# Patient Record
Sex: Female | Born: 1960 | Race: White | Hispanic: No | Smoking: Former smoker
Health system: Southern US, Community
[De-identification: ages and names within clinical notes are randomized; demographics above are authoritative.]

## PROBLEM LIST (undated history)

## (undated) DIAGNOSIS — R42 Dizziness and giddiness: Secondary | ICD-10-CM

## (undated) DIAGNOSIS — D649 Anemia, unspecified: Secondary | ICD-10-CM

## (undated) DIAGNOSIS — M8580 Other specified disorders of bone density and structure, unspecified site: Secondary | ICD-10-CM

## (undated) DIAGNOSIS — T148XXA Other injury of unspecified body region, initial encounter: Secondary | ICD-10-CM

## (undated) DIAGNOSIS — K589 Irritable bowel syndrome without diarrhea: Secondary | ICD-10-CM

## (undated) DIAGNOSIS — H9190 Unspecified hearing loss, unspecified ear: Secondary | ICD-10-CM

## (undated) DIAGNOSIS — K219 Gastro-esophageal reflux disease without esophagitis: Secondary | ICD-10-CM

## (undated) DIAGNOSIS — J302 Other seasonal allergic rhinitis: Secondary | ICD-10-CM

## (undated) DIAGNOSIS — M419 Scoliosis, unspecified: Secondary | ICD-10-CM

## (undated) DIAGNOSIS — R197 Diarrhea, unspecified: Secondary | ICD-10-CM

## (undated) DIAGNOSIS — Z9622 Myringotomy tube(s) status: Secondary | ICD-10-CM

## (undated) DIAGNOSIS — Z9889 Other specified postprocedural states: Secondary | ICD-10-CM

## (undated) DIAGNOSIS — A6000 Herpesviral infection of urogenital system, unspecified: Secondary | ICD-10-CM

## (undated) HISTORY — PX: EYE SURGERY: SHX253

## (undated) HISTORY — DX: Other specified disorders of bone density and structure, unspecified site: M85.80

## (undated) HISTORY — DX: Other injury of unspecified body region, initial encounter: T14.8XXA

## (undated) HISTORY — DX: Herpesviral infection of urogenital system, unspecified: A60.00

## (undated) HISTORY — PX: MULTIPLE TOOTH EXTRACTIONS: SHX2053

## (undated) HISTORY — PX: TONSILLECTOMY: SUR1361

## (undated) HISTORY — PX: BACK SURGERY: SHX140

## (undated) HISTORY — PX: ABDOMINAL HYSTERECTOMY: SHX81

---

## 1999-03-04 ENCOUNTER — Ambulatory Visit: Admission: RE | Admit: 1999-03-04 | Discharge: 1999-03-04 | Payer: Self-pay | Admitting: Occupational Medicine

## 1999-03-04 ENCOUNTER — Encounter: Payer: Self-pay | Admitting: Occupational Medicine

## 2003-06-30 ENCOUNTER — Ambulatory Visit (HOSPITAL_COMMUNITY): Admission: RE | Admit: 2003-06-30 | Discharge: 2003-06-30 | Payer: Self-pay | Admitting: Family Medicine

## 2003-07-04 ENCOUNTER — Encounter: Admission: RE | Admit: 2003-07-04 | Discharge: 2003-08-11 | Payer: Self-pay | Admitting: Family Medicine

## 2005-06-16 ENCOUNTER — Emergency Department (HOSPITAL_COMMUNITY): Admission: EM | Admit: 2005-06-16 | Discharge: 2005-06-16 | Payer: Self-pay | Admitting: Emergency Medicine

## 2005-09-21 ENCOUNTER — Ambulatory Visit (HOSPITAL_COMMUNITY): Admission: RE | Admit: 2005-09-21 | Discharge: 2005-09-21 | Payer: Self-pay | Admitting: Family Medicine

## 2006-05-16 HISTORY — PX: TOTAL ABDOMINAL HYSTERECTOMY W/ BILATERAL SALPINGOOPHORECTOMY: SHX83

## 2006-11-16 ENCOUNTER — Ambulatory Visit (HOSPITAL_COMMUNITY): Admission: RE | Admit: 2006-11-16 | Discharge: 2006-11-16 | Payer: Self-pay | Admitting: Family Medicine

## 2007-04-02 ENCOUNTER — Inpatient Hospital Stay (HOSPITAL_COMMUNITY): Admission: RE | Admit: 2007-04-02 | Discharge: 2007-04-05 | Payer: Self-pay | Admitting: Obstetrics & Gynecology

## 2007-04-02 ENCOUNTER — Encounter: Payer: Self-pay | Admitting: Obstetrics & Gynecology

## 2007-04-14 ENCOUNTER — Encounter: Payer: Self-pay | Admitting: Emergency Medicine

## 2007-04-15 ENCOUNTER — Encounter: Payer: Self-pay | Admitting: Obstetrics & Gynecology

## 2007-04-15 ENCOUNTER — Inpatient Hospital Stay (HOSPITAL_COMMUNITY): Admission: AD | Admit: 2007-04-15 | Discharge: 2007-04-17 | Payer: Self-pay | Admitting: Obstetrics & Gynecology

## 2007-04-15 ENCOUNTER — Ambulatory Visit: Payer: Self-pay | Admitting: Obstetrics & Gynecology

## 2007-07-29 ENCOUNTER — Emergency Department (HOSPITAL_COMMUNITY): Admission: EM | Admit: 2007-07-29 | Discharge: 2007-07-29 | Payer: Self-pay | Admitting: Emergency Medicine

## 2007-10-24 ENCOUNTER — Ambulatory Visit: Payer: Self-pay | Admitting: *Deleted

## 2007-10-24 ENCOUNTER — Ambulatory Visit: Payer: Self-pay | Admitting: Family Medicine

## 2007-10-24 DIAGNOSIS — H9190 Unspecified hearing loss, unspecified ear: Secondary | ICD-10-CM | POA: Insufficient documentation

## 2007-10-24 DIAGNOSIS — M4120 Other idiopathic scoliosis, site unspecified: Secondary | ICD-10-CM | POA: Insufficient documentation

## 2007-10-24 DIAGNOSIS — B009 Herpesviral infection, unspecified: Secondary | ICD-10-CM | POA: Insufficient documentation

## 2007-10-24 LAB — CONVERTED CEMR LAB
AST: 15 units/L (ref 0–37)
Albumin: 4.4 g/dL (ref 3.5–5.2)
Basophils Absolute: 0 10*3/uL (ref 0.0–0.1)
Basophils Relative: 0 % (ref 0–1)
Cholesterol: 189 mg/dL (ref 0–200)
Creatinine, Ser: 0.54 mg/dL (ref 0.40–1.20)
Eosinophils Absolute: 0.1 10*3/uL (ref 0.0–0.7)
Eosinophils Relative: 2 % (ref 0–5)
HCT: 40.7 % (ref 36.0–46.0)
HDL: 68 mg/dL (ref >39)
Hemoglobin: 13.2 g/dL (ref 12.0–15.0)
LDL Cholesterol: 87 mg/dL (ref 0–99)
Lymphs Abs: 1.2 10*3/uL (ref 0.7–4.0)
MCV: 89.8 fL (ref 78.0–100.0)
Monocytes Absolute: 0.4 10*3/uL (ref 0.1–1.0)
Monocytes Relative: 10 % (ref 3–12)
Neutro Abs: 2.6 10*3/uL (ref 1.7–7.7)
Platelets: 231 10*3/uL (ref 150–400)
RDW: 13.5 % (ref 11.5–15.5)
Sodium: 140 meq/L (ref 135–145)
TSH: 2.158 microintl units/mL (ref 0.350–5.50)
Total Protein: 6.8 g/dL (ref 6.0–8.3)
Triglycerides: 170 mg/dL — ABNORMAL HIGH (ref <150)
VLDL: 34 mg/dL (ref 0–40)

## 2007-10-30 ENCOUNTER — Encounter (INDEPENDENT_AMBULATORY_CARE_PROVIDER_SITE_OTHER): Payer: Self-pay | Admitting: Family Medicine

## 2007-12-04 ENCOUNTER — Ambulatory Visit: Payer: Self-pay | Admitting: Family Medicine

## 2007-12-04 DIAGNOSIS — R358 Other polyuria: Secondary | ICD-10-CM

## 2007-12-05 ENCOUNTER — Encounter (INDEPENDENT_AMBULATORY_CARE_PROVIDER_SITE_OTHER): Payer: Self-pay | Admitting: Family Medicine

## 2007-12-25 ENCOUNTER — Ambulatory Visit (HOSPITAL_COMMUNITY): Admission: RE | Admit: 2007-12-25 | Discharge: 2007-12-25 | Payer: Self-pay | Admitting: Family Medicine

## 2007-12-31 ENCOUNTER — Ambulatory Visit (HOSPITAL_COMMUNITY): Admission: RE | Admit: 2007-12-31 | Discharge: 2007-12-31 | Payer: Self-pay | Admitting: Family Medicine

## 2008-02-29 ENCOUNTER — Ambulatory Visit: Payer: Self-pay | Admitting: Cardiology

## 2008-03-02 ENCOUNTER — Emergency Department (HOSPITAL_COMMUNITY): Admission: EM | Admit: 2008-03-02 | Discharge: 2008-03-02 | Payer: Self-pay | Admitting: Emergency Medicine

## 2008-03-17 ENCOUNTER — Ambulatory Visit: Payer: Self-pay

## 2008-06-17 ENCOUNTER — Encounter (HOSPITAL_COMMUNITY): Admission: RE | Admit: 2008-06-17 | Discharge: 2008-07-17 | Payer: Self-pay | Admitting: Family Medicine

## 2008-06-17 ENCOUNTER — Ambulatory Visit (HOSPITAL_COMMUNITY): Admission: RE | Admit: 2008-06-17 | Discharge: 2008-06-17 | Payer: Self-pay | Admitting: Family Medicine

## 2008-07-22 ENCOUNTER — Encounter (HOSPITAL_COMMUNITY): Admission: RE | Admit: 2008-07-22 | Discharge: 2008-08-21 | Payer: Self-pay | Admitting: Family Medicine

## 2008-09-19 ENCOUNTER — Emergency Department (HOSPITAL_COMMUNITY): Admission: EM | Admit: 2008-09-19 | Discharge: 2008-09-19 | Payer: Self-pay | Admitting: Emergency Medicine

## 2008-10-07 ENCOUNTER — Encounter (HOSPITAL_COMMUNITY): Admission: RE | Admit: 2008-10-07 | Discharge: 2008-11-06 | Payer: Self-pay | Admitting: Specialist

## 2009-01-30 ENCOUNTER — Ambulatory Visit (HOSPITAL_COMMUNITY): Admission: RE | Admit: 2009-01-30 | Discharge: 2009-01-30 | Payer: Self-pay | Admitting: Family Medicine

## 2009-09-13 ENCOUNTER — Emergency Department (HOSPITAL_COMMUNITY): Admission: EM | Admit: 2009-09-13 | Discharge: 2009-09-13 | Payer: Self-pay | Admitting: Emergency Medicine

## 2009-10-18 ENCOUNTER — Emergency Department (HOSPITAL_COMMUNITY): Admission: EM | Admit: 2009-10-18 | Discharge: 2009-10-18 | Payer: Self-pay | Admitting: Emergency Medicine

## 2009-11-28 ENCOUNTER — Emergency Department (HOSPITAL_COMMUNITY): Admission: EM | Admit: 2009-11-28 | Discharge: 2009-11-28 | Payer: Self-pay | Admitting: Emergency Medicine

## 2010-01-28 ENCOUNTER — Ambulatory Visit (HOSPITAL_COMMUNITY): Admission: RE | Admit: 2010-01-28 | Discharge: 2010-01-28 | Payer: Self-pay | Admitting: Family Medicine

## 2010-03-11 ENCOUNTER — Ambulatory Visit (HOSPITAL_COMMUNITY): Admission: RE | Admit: 2010-03-11 | Discharge: 2010-03-11 | Payer: Self-pay | Admitting: Family Medicine

## 2010-04-02 ENCOUNTER — Encounter (HOSPITAL_COMMUNITY)
Admission: RE | Admit: 2010-04-02 | Discharge: 2010-05-02 | Payer: Self-pay | Source: Home / Self Care | Attending: Family Medicine | Admitting: Family Medicine

## 2010-05-04 ENCOUNTER — Encounter (HOSPITAL_COMMUNITY)
Admission: RE | Admit: 2010-05-04 | Discharge: 2010-06-03 | Payer: Self-pay | Source: Home / Self Care | Attending: Family Medicine | Admitting: Family Medicine

## 2010-06-07 ENCOUNTER — Encounter: Payer: Self-pay | Admitting: Family Medicine

## 2010-08-03 LAB — URINE MICROSCOPIC-ADD ON

## 2010-08-03 LAB — URINALYSIS, ROUTINE W REFLEX MICROSCOPIC
Bilirubin Urine: NEGATIVE
Glucose, UA: NEGATIVE mg/dL
Ketones, ur: NEGATIVE mg/dL
Nitrite: NEGATIVE
Protein, ur: NEGATIVE mg/dL
Specific Gravity, Urine: 1.02 (ref 1.005–1.030)
Urobilinogen, UA: 0.2 mg/dL (ref 0.0–1.0)
pH: 7 (ref 5.0–8.0)

## 2010-08-24 LAB — URINALYSIS, ROUTINE W REFLEX MICROSCOPIC
Bilirubin Urine: NEGATIVE
Glucose, UA: NEGATIVE mg/dL
Hgb urine dipstick: NEGATIVE
Nitrite: NEGATIVE
Specific Gravity, Urine: <1.005 — ABNORMAL LOW (ref 1.005–1.030)

## 2010-08-24 LAB — DIFFERENTIAL
Basophils Absolute: 0 10*3/uL (ref 0.0–0.1)
Eosinophils Absolute: 0 10*3/uL (ref 0.0–0.7)
Eosinophils Relative: 0 % (ref 0–5)
Lymphs Abs: 0.3 10*3/uL — ABNORMAL LOW (ref 0.7–4.0)
Monocytes Absolute: 0.2 10*3/uL (ref 0.1–1.0)

## 2010-08-24 LAB — CBC
Hemoglobin: 12.7 g/dL (ref 12.0–15.0)
MCHC: 35.6 g/dL (ref 30.0–36.0)
MCV: 84.6 fL (ref 78.0–100.0)
Platelets: 156 10*3/uL (ref 150–400)
RBC: 4.22 MIL/uL (ref 3.87–5.11)

## 2010-08-24 LAB — BASIC METABOLIC PANEL
BUN: 5 mg/dL — ABNORMAL LOW (ref 6–23)
CO2: 24 mEq/L (ref 19–32)
Chloride: 109 mEq/L (ref 96–112)
Potassium: 3.1 mEq/L — ABNORMAL LOW (ref 3.5–5.1)

## 2010-09-28 NOTE — Op Note (Signed)
[name], [name]             ACCOUNT NO.:  [id number]   MEDICAL RECORD NO.:  [id number]          PATIENT TYPE:  INP   LOCATION:  A336                          FACILITY:  [facility]   PHYSICIAN:  Lazaro Arms, M.D.   DATE OF BIRTH:  1961-04-17   DATE OF PROCEDURE:  04/02/2007  DATE OF DISCHARGE:                               OPERATIVE REPORT   PREOPERATIVE DIAGNOSES:  1. Chronic suprapubic pelvic and abdominal pain.  2. Enlarged fibroid uterus, 10 weeks size.  3. Menometrorrhagia.  4. Dysmenorrhea.  5. Dyspareunia.   POSTOPERATIVE DIAGNOSES:  1. Chronic suprapubic pelvic and abdominal pain.  2. Enlarged fibroid uterus, 10 weeks size.  3. Menometrorrhagia.  4. Dysmenorrhea.  5. Dyspareunia.  6. 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   PROCEDURE:  Total abdominal hysterectomy with bilateral salpingo-  oophorectomy.   SURGEON:  Lazaro Arms, M.D.   ANESTHESIA:  General endotracheal.   FINDINGS:  The ovaries were normal bilaterally.  They looked like they  were perimenopausal, becoming atrophic.  The uterus had a large  posterior myoma that we knew from ultrasound.  It was about 10 weeks  size total.  She had really dense, adherent bladder to the lower uterine  segment and to the cervix.  This was taken down sharply throughout.  I  did not want to push it for fear of putting a hole in the bladder by  just blunting tearing it, so the whole dissection was done sharply.  I  feel confident there was no bladder injury, no blood in the urine.  She  also had a lot of peritoneal oozing.  It just seemed like she just sort  of wept from all of her surfaces.  Her liver function tests and labs  were normal.  She takes a lot of Tylenol products but not that I am  aware of, any significant aspirin products.  I will delve into that more  fully.  Her peritoneal surfaces just sort of oozed throughout the case.   DESCRIPTION OF OPERATION:   Patient was taken to the operating room and  placed in a supine position, where she underwent general endotracheal  anesthesia.  The vagina was prepped.  Foley catheter was placed.  The  abdomen was then prepped and draped in a usual sterile fashion.  A  Pfannenstiel incision was made and carried down sharply to the rectus  fascia, scored in the midline, extended laterally.  The fascia was taken  off the muscle superiorly and inferiorly without difficulty.  There  really was no rectus muscle there, there were just kind of adhesions of  dense fascia, which probably included the muscle as well.  The  peritoneal cavity was entered without difficulty.  An __________  self-  retaining wound retractor was placed.  The upper abdomen was packed  away. The uterine cornua were grasped.  The uterus was elevated.  Could  see the dense adhesions of the bladder to the lower segment of the  cervix upon entering the peritoneal cavity.  The  round ligaments were  sutured ligated and cut bilaterally.  The infundibulopelvic ligaments  were isolated.  A vascular window made.  They were clamped, cut, and  double-suture ligated bilaterally with good hemostasis.  The uterine  vessel were skeletonized.  The bladder was taken down sharply with  Metzenbaum scissors.  Also, intrafascial incision was made with the  knife, and it was taken down from there.  This just kind of caused the  backside of the bladder peritoneal surface to ooze.  The uterus vessels  were clamped, cut, and suture-ligated.  Serial pedicels were taken down  to the cervix through the cardinal ligament with each pedicle being  clamped, cut, and suture-ligated.  The pelvis was irrigated vigorously  at this point.  Again, there were no loose pedicles, just the peritoneal  surfaces.  The vagina was cross-clamped.  Specimen was removed.  The  vaginal angle sutures were placed.  The vagina was closed with  interrupted figure-of-eight sutures.  The  pelvis was irrigated  vigorously.  Interrupted figure-of-eight 3-0 Monocryls were placed on  the bladder in sort of a very superficial, serosal imbricating manner in  order to maintain hemostasis, so as not to have a thermal injury to the  bladder.  Several interrupted sutures were placed throughout the pelvis,  infundibulopelvic ligaments, just the peritoneal surfaces, bladder,  between the infundibulopelvic ligament and the uterine cornua, all just  sort of oozed.  Care was taken to avoid the ureters bilaterally.  Gelfoam with thrombin was then placed in the pelvis for additional  hemostasis.  Interceed was placed over this.  All pedicles were  hemostatic.  The sutures were then cut.  The pelvis was once again  inspected and found to be hemostatic.  The peritoneum was closed.  The  fascia was then closed in a running fashion.  The subcutaneous tissue is  made hemostatic and irrigated.  The skin was closed using skin staples.  Pressure dressing was placed.  The patient tolerated the procedure well.  She experienced about 400 cc of blood loss and was taken to the recovery  room in good, stable condition.  All counts were correct x3.  She  received Ancef prophylactically.      Lazaro Arms, M.D.  Electronically Signed     LHE/MEDQ  D:  04/02/2007  T:  04/02/2007  Job:  [id number]

## 2010-09-28 NOTE — H&P (Signed)
Diana Wilson, OGDEN             ACCOUNT NO.:  [id number]   MEDICAL RECORD NO.:  [id number]          PATIENT TYPE:  AMB   LOCATION:  DAY                           FACILITY:  [facility]   PHYSICIAN:  Lazaro Arms, M.D.   DATE OF BIRTH:  [birth year]   DATE OF ADMISSION:  DATE OF DISCHARGE:  LH                              HISTORY & PHYSICAL   HISTORY OF PRESENT ILLNESS:  Diana Wilson is a 50 year old white female who is  admitted for a TAH/BSO.  She has an enlarged fibroid uterus.  Ultrasound  back in July 2008 reveals a volume that is consistent with 10-12 weeks'  size.  Posteriorly, there is a submucosal extension.  She also has a  significant amount of pelvic pain and dysuria and suprapubic pain.  She  was actually seen by Dr. Lindaann Slough of the [facility] and did not find any urological problems, but did recommend  she have a hysterectomy performed.  She is actually a patient of Dr.  Emelda Fear.  He evaluated her and she has had, as I said, evaluation for  that with the ultrasound as well.  She knows her options and wants to  proceed with a hysterectomy, which I have also agree with Dr. Brunilda Payor  regarding that.   PAST MEDICAL HISTORY:  Negative except for previous herpes infection.   PAST SURGICAL HISTORY:  Negative.   PAST OBSTETRICAL HISTORY:  Nulliparous.   REVIEW OF SYSTEMS:  Otherwise negative.   PHYSICAL EXAMINATION:  HEENT:  Unremarkable.  NECK:  Thyroid is normal.  LUNGS:  Clear.  HEART:  Regular rate and rhythm without murmur, regurgitation or gallop.  BREASTS:  Without masses, discharge or skin changes.  ABDOMEN:  Benign.  No hepatosplenomegaly or masses.  PELVIC:  She has normal external genitalia.  Vagina is nulliparous  without discharge.  Uterus is approximately 10 weeks' size.  Ovaries are  normal and nontender.   IMPRESSION:  1. Enlarged fibroid uterus.  2. Menometrorrhagia.  3. Dysmenorrhea.  4. Pelvic pain.   PLAN:  The patient is admitted  for TAH/BUN.  The patient understands the  risks, benefits, indications and alternatives and will proceed.      Lazaro Arms, M.D.  Electronically Signed     LHE/MEDQ  D:  04/01/2007  T:  04/02/2007  Job:  [id number]

## 2010-09-28 NOTE — Discharge Summary (Signed)
NAMEKEIRRA, Diana Wilson             ACCOUNT NO.:  [id number]   MEDICAL RECORD NO.:  [id number]          PATIENT TYPE:  INP   LOCATION:  9317                          FACILITY:  [facility]   PHYSICIAN:  Phil D. Okey Dupre, M.D.     DATE OF BIRTH:  October 22, 1960   DATE OF ADMISSION:  04/15/2007  DATE OF DISCHARGE:  04/17/2007                               DISCHARGE SUMMARY   HOSPITAL COURSE:  The patient is a 50 year old Caucasian female who  underwent total abdominal hysterectomy and bilateral salpingo-  oophorectomy on April 02, 2007.  She was admitted with pelvic  abscess.  On reviewing the chart, she had much oozing during her  operative procedure so that she developed a small hematoma.  It was not  a surprise.  On the day of admission she had this drained by radiology  with a Duval suction bulb situated in the left lower quadrant.  At the  time of discharge she still draining a very small amount, although has  been afebrile for 48 hours.  Her white blood cell count on admission was  around 20,000.  The last two days, she has had a white blood cell count  in the 9,000s.  Other significant lab tests show she is significantly  anemic with a hematocrit of 26 and hemoglobin 8.6.  She will be placed  on iron therapy.  My intention was to remove the drain prior to her  discharge, however, she did have still a small amount of drainage and  she is going to see Dr. Silas Flood in the office on tomorrow, April 18, 2007, so I will let him remove it at that time.  We are discharging her  on Augmentin 500 mg every eight hours and Flagyl 500 mg every 12 hours.  She says she needs nothing more for pain.  Her symptoms when she came in  were a significant amount of lower quadrant pain with significant  guarding.  The abdominal exam was at this point benign and the problem  seems to be well resolved.   DISCHARGE DIAGNOSIS:  Postoperative infected pelvic hematoma, resolved.      Phil D. Okey Dupre, M.D.  Electronically  Signed     PDR/MEDQ  D:  04/17/2007  T:  04/17/2007  Job:  [id number]

## 2010-09-28 NOTE — Assessment & Plan Note (Signed)
[facility]                            CARDIOLOGY OFFICE NOTE   NAME:Diana Wilson, Diana Wilson                    MRN:          [id number]  DATE:02/29/2008                            DOB:          Jan 10, 1961    [name] is a pleasant 50 year old female who presents for  evaluation of dyspnea and chest pain.  She has no prior cardiac history.  She states that for the past 4-5 years, she has had occasional dyspnea.  It occurs with walking fast or climbing stairs.  It does not occur with  routine activities.  There is no orthopnea, PND, but she does  occasionally have mild pedal edema by her report.  Note, there is no  associated cough or hemoptysis.  She also occasionally feels a chest  tightness, tightness in the upper chest area.  It is not pleuritic  positional nor is it related to food.  It occurs with stress.  It  lasts for approximately 5 minutes and resolves spontaneously.  Note, she  does not have exertional chest pain.  The pain does not radiate.  She  occasionally has diaphoresis and nausea by her report.  Because of the  above, we were asked to further evaluate.   MEDICATIONS:  1. Multivitamin.  2. Calcium 600/D b.i.d.  3. Acyclovir.  4. Vitamin E.  5. She takes Pepcid as needed.   ALLERGIES:  SULFA as well as CODEINE, FOSAMAX and most PAIN MEDICATIONS  by her report.   SOCIAL HISTORY:  She has remote history of tobacco use, but has not  smoked in 20 years.  She occasionally consumes alcohol.  She is married.   FAMILY HISTORY:  Unknown as she is adopted.   PAST MEDICAL HISTORY:  There is no diabetes mellitus, hypertension, or  hyperlipidemia.  She does have history of gastroesophageal reflux  disease.  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  She communicates reading lips.  She has had extensive previous  back surgery at age 52 due to scoliosis.  She has had a prior  hysterectomy as well as tonsillectomy.   She does have a history of  gastroesophageal reflux disease.   REVIEW OF SYSTEMS:  He denies any headaches, fevers, or chills.  There  is no productive cough or hemoptysis.  There is no dysphagia,  odynophagia, melena, or hematochezia.  There is no dysuria or hematuria.  No rash or seizure activity.  There is no orthopnea or PND, but there  can be mild pedal edema.  The remaining systems are negative.   PHYSICAL EXAMINATION:  VITAL SIGNS:  Today, her blood pressure 100/80  and her pulse is 59.  She weighs 140 pounds.  GENERAL:  She is well-developed, well-nourished, in no acute stress.  SKIN:  Warm and dry.  She does not appear to be depressed.  There is no  peripheral clubbing.  BACK:  She has had extensive previous back surgery on examination.  HEENT:  Unremarkable with exception of poor dentition.  She also has  deafness in her left ear.  Her eyelids are  normal.  NECK:  Supple with normal upstroke bilaterally and there are no bruits  noted.  There is no jugular venous distention and no thyromegaly is  noted.  CHEST:  Clear to auscultation.  No expansion.  CARDIOVASCULAR:  Regular rhythm.  Normal S1 and S2.  There are no  murmurs, rubs, or gallops noted.  There is no change Valsalva.  ABDOMEN:  Nontender and nondistended.  Positive bowel sounds.  No  hepatosplenomegaly.  No mass appreciated.  There is no abdominal bruit.  Note, she has significant stria over her abdomen.  EXTREMITIES:  She has 2+ femoral pulses bilaterally.  No bruits.  Extremities showed no edema and I can palpate no cords.  She has 2+  posterior tibial pulses bilaterally.  NEUROLOGIC:  Grossly intact.   Her electrocardiogram shows a sinus rhythm at a rate of 59.  The axis is  normal.  There are no significant ST changes.   DIAGNOSES:  1. Atypical chest pain - Ms. Boulais's symptoms are atypical.  We      will plan to proceed with a Myoview for risk stratification.  If it      shows no ischemia then we will  not pursue further cardiac workup.  2. Dyspnea - she is not volume overloaded on examination today and the      etiology of this also is unclear.  As above, we will do a stress      Myoview and this will also help [place] to quantitate her left      ventricular function.  If it is unremarkable then we will not      pursue further.  3. Gastroesophageal reflux disease.   We will see her back on as-needed basis pending the results of her  Myoview.     Madolyn Frieze Jens Som, MD, [facility]  Electronically Signed    BSC/MedQ  DD: 02/29/2008  DT: 03/01/2008  Job #: [id number]   cc:   Donna Bernard, M.D.

## 2010-10-01 NOTE — Discharge Summary (Signed)
[name], Diana Wilson             ACCOUNT NO.:  [id number]   MEDICAL RECORD NO.:  [id number]          PATIENT TYPE:  INP   LOCATION:  A336                          FACILITY:  [facility]   PHYSICIAN:  Lazaro Arms, M.D.   DATE OF BIRTH:  1960-12-18   DATE OF ADMISSION:  04/02/2007  DATE OF DISCHARGE:  11/20/2008LH                               DISCHARGE SUMMARY   DISCHARGE DIAGNOSES:  1. Status post total abdominal hysterectomy and bilateral salpingo-      oophorectomy.  2. Severe adhesions of the bladder to the lower uterine segment.  3. Increased intraoperative surface oozing requiring [place] of Gelfoam.   PROCEDURES:  1. Total abdominal hysterectomy.  2. Bilateral salpingo-oophorectomy.   HISTORY:  Please refer to the history and physical and the operative for  details on admission to the hospital.   HOSPITAL COURSE:  The patient was admitted postoperatively.  She  tolerated clear liquids and a regular diet.  She did have a lot more  intraoperative oozing than I would have expected.  She does take  nonsteroidals and she has always said that she bleeds and bruises  easily.  I had to use Gelfoam intraoperatively, which I really never do,  but I tied off everything I could tie off and that is all that I had  left at that point.  I did not put a drain in because it was not  accumulating very much, probably in the order of maybe 30-40 mL a day,  which she should resorb.  Her hemoglobin and hematocrit were 10 and 29.3  on postop day #1.  On postop day #2, they were 8.9 and 26.6, but on  postop day #3, they were back up to 9.3 and 27.1; white count was  normal.  She was afebrile.  She did well on Dilaudid and ibuprofen  combination.  Her incision was clean, dry and intact.  She did have  bruising, which I expected, and I explained to them the difficulty with  her bladder, as it was basically welded to her lower uterine segment and  this was anticipated.  She saw a urologist, who basically  recommended a  hysterectomy because of what he felt like were dense adhesions.  Her  abdominal exam was benign at the time of discharge.  She tolerated clear  liquids and a regular diet.  She was voiding.  She was ambulatory.  She  was tolerating the Dilaudid and the ibuprofen for pain.  She was  discharged to home on the morning of postop day #3 in good and stable  condition, to follow up in the office next week for incision evaluation.  The patient was given instructions and precautions to return prior to  that time.      Lazaro Arms, M.D.  Electronically Signed     LHE/MEDQ  D:  04/26/2007  T:  04/26/2007  Job:  [id number]

## 2010-10-14 ENCOUNTER — Emergency Department (HOSPITAL_COMMUNITY)
Admission: EM | Admit: 2010-10-14 | Discharge: 2010-10-14 | Disposition: A | Discharge status: Home / Self Care | Payer: Medicare Other | Attending: Emergency Medicine | Admitting: Emergency Medicine

## 2010-10-14 DIAGNOSIS — K219 Gastro-esophageal reflux disease without esophagitis: Secondary | ICD-10-CM | POA: Insufficient documentation

## 2010-10-14 DIAGNOSIS — Z79899 Other long term (current) drug therapy: Secondary | ICD-10-CM | POA: Insufficient documentation

## 2010-10-14 DIAGNOSIS — G43909 Migraine, unspecified, not intractable, without status migrainosus: Secondary | ICD-10-CM | POA: Insufficient documentation

## 2010-12-30 ENCOUNTER — Telehealth (INDEPENDENT_AMBULATORY_CARE_PROVIDER_SITE_OTHER): Payer: Self-pay | Admitting: *Deleted

## 2010-12-30 DIAGNOSIS — Z1211 Encounter for screening for malignant neoplasm of colon: Secondary | ICD-10-CM

## 2010-12-30 MED ORDER — SOD PHOS MONO-SOD PHOS DIBASIC 1.102-0.398 G PO TABS: 1.0000 | ORAL_TABLET | Freq: Once | ORAL | Status: DC

## 2010-12-30 NOTE — Telephone Encounter (Signed)
TCS sch'd 04/13/11 @ 9:30 (8:30), osmo prep instructions mailed

## 2011-02-04 ENCOUNTER — Other Ambulatory Visit: Payer: Self-pay | Admitting: Family Medicine

## 2011-02-04 DIAGNOSIS — Z139 Encounter for screening, unspecified: Secondary | ICD-10-CM

## 2011-02-07 LAB — CBC
Hemoglobin: 13.1
MCHC: 34.4
Platelets: 230
RDW: 15.2

## 2011-02-07 LAB — POCT I-STAT CREATININE
Creatinine, Ser: 0.6
Operator id: 294341

## 2011-02-07 LAB — URINALYSIS, ROUTINE W REFLEX MICROSCOPIC
Bilirubin Urine: NEGATIVE
Nitrite: NEGATIVE
Protein, ur: 100 — AB
Specific Gravity, Urine: 1.018
Urobilinogen, UA: 0.2

## 2011-02-07 LAB — I-STAT 8, (EC8 V) (CONVERTED LAB)
Acid-Base Excess: 1
Hemoglobin: 14.3
Potassium: 4
Sodium: 137
TCO2: 27
pH, Ven: 7.406 — ABNORMAL HIGH

## 2011-02-07 LAB — URINE MICROSCOPIC-ADD ON

## 2011-02-07 LAB — URINE CULTURE: Colony Count: >=100000

## 2011-02-07 LAB — WET PREP, GENITAL

## 2011-02-07 LAB — DIFFERENTIAL
Basophils Absolute: 0
Basophils Relative: 0
Lymphocytes Relative: 8 — ABNORMAL LOW
Neutro Abs: 9.4 — ABNORMAL HIGH
Neutrophils Relative %: 90 — ABNORMAL HIGH

## 2011-02-18 ENCOUNTER — Ambulatory Visit (HOSPITAL_COMMUNITY)
Admission: RE | Admit: 2011-02-18 | Discharge: 2011-02-18 | Disposition: A | Discharge status: Home / Self Care | Payer: Medicare Other | Source: Ambulatory Visit | Attending: Family Medicine | Admitting: Family Medicine

## 2011-02-18 DIAGNOSIS — Z1231 Encounter for screening mammogram for malignant neoplasm of breast: Secondary | ICD-10-CM | POA: Insufficient documentation

## 2011-02-18 DIAGNOSIS — Z139 Encounter for screening, unspecified: Secondary | ICD-10-CM

## 2011-02-21 LAB — CBC
MCV: 86.4
RBC: 3 — ABNORMAL LOW
RBC: 3.09 — ABNORMAL LOW
WBC: 9.7
WBC: 9.8

## 2011-02-21 LAB — DIFFERENTIAL
Eosinophils Absolute: 0 — ABNORMAL LOW
Lymphocytes Relative: 7 — ABNORMAL LOW
Lymphs Abs: 0.7
Monocytes Relative: 3
Neutrophils Relative %: 90 — ABNORMAL HIGH

## 2011-02-21 LAB — APTT: aPTT: 36

## 2011-02-21 LAB — PROTIME-INR
INR: 1.3
Prothrombin Time: 16 — ABNORMAL HIGH

## 2011-02-22 LAB — DIFFERENTIAL
Basophils Absolute: 0
Basophils Absolute: 0
Basophils Relative: 0
Basophils Relative: 0
Basophils Relative: 0
Basophils Relative: 0
Eosinophils Absolute: 0 — ABNORMAL LOW
Eosinophils Absolute: 0.1 — ABNORMAL LOW
Eosinophils Absolute: 0.1 — ABNORMAL LOW
Eosinophils Relative: 0
Eosinophils Relative: 0
Lymphocytes Relative: 18
Lymphocytes Relative: 4 — ABNORMAL LOW
Lymphocytes Relative: 29
Lymphs Abs: 0.9
Lymphs Abs: 1
Lymphs Abs: 1.2
Monocytes Absolute: 0.5
Monocytes Absolute: 0.7
Monocytes Relative: 4
Monocytes Relative: 8
Monocytes Relative: 8
Monocytes Relative: 8
Neutro Abs: 16.8 — ABNORMAL HIGH
Neutro Abs: 18.4 — ABNORMAL HIGH
Neutro Abs: 4.6
Neutro Abs: 4.9
Neutro Abs: 2.6
Neutrophils Relative %: 72
Neutrophils Relative %: 77
Neutrophils Relative %: 79 — ABNORMAL HIGH
Neutrophils Relative %: 92 — ABNORMAL HIGH
Neutrophils Relative %: 61

## 2011-02-22 LAB — URINALYSIS, ROUTINE W REFLEX MICROSCOPIC
Bilirubin Urine: NEGATIVE
Hgb urine dipstick: NEGATIVE
Ketones, ur: NEGATIVE
Nitrite: NEGATIVE
Protein, ur: NEGATIVE
Specific Gravity, Urine: 1.01
Urobilinogen, UA: 0.2
Urobilinogen, UA: 0.2
pH: 7

## 2011-02-22 LAB — COMPREHENSIVE METABOLIC PANEL
Alkaline Phosphatase: 51
BUN: 3 — ABNORMAL LOW
BUN: 10
CO2: 27
CO2: 30
Calcium: 9.3
Chloride: 100
Creatinine, Ser: 0.59
Creatinine, Ser: 0.57
GFR calc non Af Amer: >60
GFR calc non Af Amer: >60
Glucose, Bld: 88
Total Bilirubin: 0.8

## 2011-02-22 LAB — URINE MICROSCOPIC-ADD ON

## 2011-02-22 LAB — CBC
HCT: 29.3 — ABNORMAL LOW
HCT: 30.3 — ABNORMAL LOW
Hemoglobin: 10 — ABNORMAL LOW
Hemoglobin: 10 — ABNORMAL LOW
Hemoglobin: 9.3 — ABNORMAL LOW
Hemoglobin: 12.8
MCHC: 33.5
MCHC: 34.1
MCHC: 34.2
MCHC: 33.1
MCV: 85.3
MCV: 86.2
MCV: 86.8
MCV: 86.9
MCV: 86.3
Platelets: 200
RBC: 3.07 — ABNORMAL LOW
RBC: 3.13 — ABNORMAL LOW
RBC: 3.4 — ABNORMAL LOW
RBC: 3.49 — ABNORMAL LOW
RBC: 3.56 — ABNORMAL LOW
RBC: 4.48
RDW: 13.1
RDW: 13.4
WBC: 18.5 — ABNORMAL HIGH
WBC: 20.1 — ABNORMAL HIGH
WBC: 6.5

## 2011-02-22 LAB — CULTURE, ROUTINE-ABSCESS

## 2011-02-22 LAB — TYPE AND SCREEN
ABO/RH(D): O POS
Antibody Screen: NEGATIVE

## 2011-02-22 LAB — HCG, QUANTITATIVE, PREGNANCY: hCG, Beta Chain, Quant, S: <2

## 2011-03-23 ENCOUNTER — Other Ambulatory Visit (INDEPENDENT_AMBULATORY_CARE_PROVIDER_SITE_OTHER): Payer: Self-pay | Admitting: *Deleted

## 2011-03-23 DIAGNOSIS — Z1211 Encounter for screening for malignant neoplasm of colon: Secondary | ICD-10-CM

## 2011-05-04 ENCOUNTER — Telehealth (INDEPENDENT_AMBULATORY_CARE_PROVIDER_SITE_OTHER): Payer: Self-pay | Admitting: *Deleted

## 2011-05-04 NOTE — Telephone Encounter (Signed)
PCP/Requesting MD:  Lubertha South  Name: Diana Wilson  DOB: 04/13/1961   Procedure: TCS  Reason/Indication:  SCREENING  Has patient had this procedure before?  NO  If so, when, by whom and where?    Is there a family history of colon cancer?  NO  Who?  What age when diagnosed?    Is patient diabetic?   NO      Does patient have prosthetic heart valve?  NO  Do you have a pacemaker?  NO  Has patient had joint replacement within last 12 months?  NO  Is patient on Coumadin, Plavix and/or Aspirin? NO  Medications: DICLOFENAC 50 MG PRN, ACYCLOBIR 200 MG BID, LORATADINE 10 MG DAILY, OMEPRAZOLE 20 MG DAILY, ONE A DAY VIT FOR WOMEN, FISH OIL 1000 MG BID, NIACIN 500 MG DAILY, CALCIUM 600 MG + VIT D BID, SOY ISOFLAVONES 40 MG BID, SUPER STRENGTH PROBIOTIC, VIT C 500 MG DAILY  Allergies: PER PATIENT ALL PAIN MEDICATIONS, SULFUR DRUGS  Pharmacy: [facility] [place]  Medication Adjustment: NONE  Procedure date & time: 05/26/11 @ 830  Other: patient states has steel rods in back

## 2011-05-06 NOTE — Telephone Encounter (Signed)
approved

## 2011-05-25 MED ORDER — SODIUM CHLORIDE 0.45 % IV SOLN
Freq: Once | INTRAVENOUS | Status: AC
  Administered 2011-05-26: 08:00:00 via INTRAVENOUS

## 2011-05-26 ENCOUNTER — Other Ambulatory Visit (INDEPENDENT_AMBULATORY_CARE_PROVIDER_SITE_OTHER): Payer: Self-pay | Admitting: Internal Medicine

## 2011-05-26 ENCOUNTER — Encounter (HOSPITAL_COMMUNITY): Payer: Self-pay | Admitting: *Deleted

## 2011-05-26 ENCOUNTER — Ambulatory Visit (HOSPITAL_COMMUNITY)
Admission: RE | Admit: 2011-05-26 | Discharge: 2011-05-26 | Disposition: A | Discharge status: Home / Self Care | Payer: Medicare Other | Source: Ambulatory Visit | Attending: Internal Medicine | Admitting: Internal Medicine

## 2011-05-26 ENCOUNTER — Encounter (HOSPITAL_COMMUNITY)
Admission: RE | Disposition: A | Discharge status: Home / Self Care | Payer: Self-pay | Source: Ambulatory Visit | Attending: Internal Medicine

## 2011-05-26 DIAGNOSIS — K6289 Other specified diseases of anus and rectum: Secondary | ICD-10-CM

## 2011-05-26 DIAGNOSIS — Z1211 Encounter for screening for malignant neoplasm of colon: Secondary | ICD-10-CM

## 2011-05-26 DIAGNOSIS — K5730 Diverticulosis of large intestine without perforation or abscess without bleeding: Secondary | ICD-10-CM | POA: Insufficient documentation

## 2011-05-26 HISTORY — DX: Gastro-esophageal reflux disease without esophagitis: K21.9

## 2011-05-26 HISTORY — DX: Dizziness and giddiness: R42

## 2011-05-26 HISTORY — DX: Anemia, unspecified: D64.9

## 2011-05-26 HISTORY — DX: Unspecified hearing loss, unspecified ear: H91.90

## 2011-05-26 HISTORY — DX: Diarrhea, unspecified: R19.7

## 2011-05-26 HISTORY — DX: Myringotomy tube(s) status: Z96.22

## 2011-05-26 HISTORY — DX: Other seasonal allergic rhinitis: J30.2

## 2011-05-26 HISTORY — DX: Scoliosis, unspecified: M41.9

## 2011-05-26 HISTORY — DX: Other specified postprocedural states: Z98.890

## 2011-05-26 HISTORY — DX: Irritable bowel syndrome, unspecified: K58.9

## 2011-05-26 HISTORY — PX: COLONOSCOPY: SHX5424

## 2011-05-26 SURGERY — COLONOSCOPY
Anesthesia: Moderate Sedation

## 2011-05-26 MED ORDER — MEPERIDINE HCL 50 MG/ML IJ SOLN
INTRAMUSCULAR | Status: AC
  Filled 2011-05-26: qty 1

## 2011-05-26 MED ORDER — MIDAZOLAM HCL 5 MG/5ML IJ SOLN
INTRAMUSCULAR | Status: AC
  Filled 2011-05-26: qty 10

## 2011-05-26 MED ORDER — MEPERIDINE HCL 50 MG/ML IJ SOLN
INTRAMUSCULAR | Status: DC | PRN
  Administered 2011-05-26: 25 mg via INTRAVENOUS
  Administered 2011-05-26: 10 mg via INTRAVENOUS
  Administered 2011-05-26: 15 mg via INTRAVENOUS

## 2011-05-26 MED ORDER — MIDAZOLAM HCL 5 MG/5ML IJ SOLN
INTRAMUSCULAR | Status: DC | PRN
  Administered 2011-05-26 (×3): 1 mg via INTRAVENOUS
  Administered 2011-05-26: 2 mg via INTRAVENOUS
  Administered 2011-05-26: 1 mg via INTRAVENOUS
  Administered 2011-05-26: 2 mg via INTRAVENOUS

## 2011-05-26 MED ORDER — HYDROCORTISONE ACETATE 25 MG RE SUPP: 25.0000 mg | Freq: Every day | RECTAL | Status: AC

## 2011-05-26 MED ORDER — STERILE WATER FOR IRRIGATION IR SOLN
Status: DC | PRN
  Administered 2011-05-26: 08:00:00

## 2011-05-26 NOTE — Op Note (Signed)
COLONOSCOPY PROCEDURE REPORT  PATIENT:  Diana Wilson  MR#:  [id number] Birthdate:  11-06-1960, 51 y.o., female Endoscopist:  Dr. Malissa Hippo, MD Referred By:  Dr. Lennette Bihari, MD Procedure Date: 05/26/2011  Procedure:   Colonoscopy  Indications:  Patient is 51 year old Caucasian female who is undergoing average risk screening colonoscopy. She has IBS and occasional hematochezia felt secondary to hemorrhoids.  Informed Consent:  Procedure and risks were reviewed with the patient and informed consent was obtained.  Medications:  Demerol 50 mg IV Versed 8 mg IV  Description of procedure:  After a digital rectal exam was performed, that colonoscope was advanced from the anus through the rectum and colon to the area of the cecum, ileocecal valve and appendiceal orifice. The cecum was deeply intubated. These structures were well-seen and photographed for the record. From the level of the cecum and ileocecal valve, the scope was slowly and cautiously withdrawn. The mucosal surfaces were carefully surveyed utilizing scope tip to flexion to facilitate fold flattening as needed. The scope was pulled down into the rectum where a thorough exam including retroflexion was performed. Turning the patient on the right site facilitated passage of scope from sigmoid and descending colon  Findings:   Prep excellent. Noncompliant tortuous sigmoid colon with few diverticula. Another single diverticulum at hepatic flexure. Distal rectal mucosa with erythema friability and multiple erosions. Biopsy taken for routine histology. Anorectal junction unremarkable as seen on retroflexed view.  Therapeutic/Diagnostic Maneuvers Performed:  See above.  Complications:  None  Cecal Withdrawal Time:  8 minutes  Impression:  Examination performed to cecum. Noncompliant tortuous sigmoid colon with few small diverticula along with another diverticulum at hepatic flexure. Distal proctitis with  erosions. Biopsy taken  Recommendations:  High fiber diet. Anusol HC Suppository 1 per rectum each bedtime for 2 weeks. I will be contacting patient with biopsy results.  Orianna Biskup U  05/26/2011 9:32 AM  CC: Dr. Harlow Asa, MD, MD & Dr. Bonnetta Barry ref. provider found

## 2011-05-26 NOTE — H&P (Signed)
Diana Wilson is an 51 y.o. female.   Chief Complaint: Patient is here for colonoscopy. HPI: Patient is 51 year old Caucasian female who is here for average risk screening colonoscopy. She denies abdominal pain anorexia or weight loss. She does complain of occasional hematochezia. She has history of IBS with irregular bowel movements. Family history is negative for colorectal carcinoma  Past Medical History  Diagnosis Date  . Asthma   . Migraine   . Lightheadedness   . Seasonal allergies   . Irritable bowel syndrome   . Scoliosis   . GERD (gastroesophageal reflux disease)   . Constipation   . Diarrhea   . Anemia   . Hard of hearing   . History of submucous nasal surgery   . S/P myringotomy with insertion of tube     bilateral    Past Surgical History  Procedure Date  . Back surgery   . Abdominal hysterectomy   . Tonsillectomy   . Eye surgery     Family History  Problem Relation Age of Onset  . Adopted: Yes   Social History:  reports that she has quit smoking. She does not have any smokeless tobacco history on file. She reports that she does not drink alcohol. Her drug history not on file.  Allergies:  Allergies  Allergen Reactions  . Codeine   . Pseudoephedrine   . Sulfonamide Derivatives     Medications Prior to Admission  Medication Dose Route Frequency Provider Last Rate Last Dose  . 0.45 % sodium chloride infusion   Intravenous Once Malissa Hippo, MD 20 mL/hr at 05/26/11 0757    . meperidine (DEMEROL) 50 MG/ML injection           . midazolam (VERSED) 5 MG/5ML injection           . simethicone susp in sterile water 1000 mL irrigation    PRN Malissa Hippo, MD       Medications Prior to Admission  Medication Sig Dispense Refill  . acyclovir (ZOVIRAX) 400 MG tablet Take 400 mg by mouth daily.      . Calcium Carbonate-Vitamin D (CALCIUM + D) 600-200 MG-UNIT TABS Take 1 tablet by mouth 2 (two) times daily.      . fish oil-omega-3 fatty acids 1000 MG  capsule Take 1 g by mouth 2 (two) times daily.      Marland Kitchen lactobacillus acidophilus (BACID) TABS Take 1 tablet by mouth daily.      . Multiple Vitamin (MULITIVITAMIN WITH MINERALS) TABS Take 1 tablet by mouth daily.      . niacin 500 MG tablet Take 500 mg by mouth daily with breakfast.      . omeprazole (PRILOSEC) 20 MG capsule Take 20 mg by mouth daily.      . Soy Isoflavones 40 MG TABS Take 80 mg by mouth daily.      . vitamin C (ASCORBIC ACID) 500 MG tablet Take 500 mg by mouth daily.        No results found for this or any previous visit (from the past 48 hour(s)). No results found.  Review of Systems  Constitutional: Negative for weight loss.  HENT: Positive for hearing loss.   Gastrointestinal: Negative for abdominal pain, diarrhea, constipation and melena.    Blood pressure 111/69, pulse 70, temperature 98 F (36.7 C), temperature source Oral, resp. rate 12, height 5' 1.5" (1.562 m), weight 148 lb (67.132 kg), SpO2 95.00%. Physical Exam  Constitutional: She appears well-developed and well-nourished.  HENT:  Mouth/Throat: Oropharynx is clear and moist.  Eyes: Conjunctivae are normal. No scleral icterus.  Neck: No thyromegaly present.  Cardiovascular: Normal rate, regular rhythm and normal heart sounds.   No murmur heard. Respiratory: Effort normal and breath sounds normal.  GI: Soft. She exhibits no distension and no mass. There is no tenderness.  Musculoskeletal: She exhibits no edema.  Lymphadenopathy:    She has no cervical adenopathy.  Neurological: She is alert.  Skin: Skin is warm and dry.     Assessment/Plan Average risk screening colonoscopy  REHMAN,NAJEEB U 05/26/2011, 8:47 AM

## 2011-05-30 ENCOUNTER — Encounter (HOSPITAL_COMMUNITY): Payer: Self-pay | Admitting: Internal Medicine

## 2011-05-30 ENCOUNTER — Encounter (INDEPENDENT_AMBULATORY_CARE_PROVIDER_SITE_OTHER): Payer: Self-pay | Admitting: *Deleted

## 2011-08-17 ENCOUNTER — Other Ambulatory Visit: Payer: Self-pay | Admitting: Family Medicine

## 2011-08-17 ENCOUNTER — Ambulatory Visit (HOSPITAL_COMMUNITY)
Admission: RE | Admit: 2011-08-17 | Discharge: 2011-08-17 | Disposition: A | Discharge status: Home / Self Care | Payer: Medicare Other | Source: Ambulatory Visit | Attending: Family Medicine | Admitting: Family Medicine

## 2011-08-17 DIAGNOSIS — R062 Wheezing: Secondary | ICD-10-CM | POA: Insufficient documentation

## 2011-08-17 DIAGNOSIS — Z87891 Personal history of nicotine dependence: Secondary | ICD-10-CM | POA: Insufficient documentation

## 2011-08-17 DIAGNOSIS — R079 Chest pain, unspecified: Secondary | ICD-10-CM | POA: Insufficient documentation

## 2012-01-02 ENCOUNTER — Other Ambulatory Visit: Payer: Self-pay | Admitting: Family Medicine

## 2012-01-02 DIAGNOSIS — Z139 Encounter for screening, unspecified: Secondary | ICD-10-CM

## 2012-01-11 ENCOUNTER — Other Ambulatory Visit (HOSPITAL_COMMUNITY): Payer: Self-pay | Admitting: Physician Assistant

## 2012-01-11 DIAGNOSIS — M8580 Other specified disorders of bone density and structure, unspecified site: Secondary | ICD-10-CM

## 2012-01-17 ENCOUNTER — Ambulatory Visit (HOSPITAL_COMMUNITY)
Admission: RE | Admit: 2012-01-17 | Discharge: 2012-01-17 | Disposition: A | Discharge status: Home / Self Care | Payer: Medicare Other | Source: Ambulatory Visit | Attending: Family Medicine | Admitting: Family Medicine

## 2012-01-17 ENCOUNTER — Other Ambulatory Visit: Payer: Self-pay | Admitting: Family Medicine

## 2012-01-17 DIAGNOSIS — M8580 Other specified disorders of bone density and structure, unspecified site: Secondary | ICD-10-CM

## 2012-01-17 DIAGNOSIS — M949 Disorder of cartilage, unspecified: Secondary | ICD-10-CM | POA: Insufficient documentation

## 2012-01-17 DIAGNOSIS — M899 Disorder of bone, unspecified: Secondary | ICD-10-CM | POA: Insufficient documentation

## 2012-02-20 ENCOUNTER — Ambulatory Visit (HOSPITAL_COMMUNITY)
Admission: RE | Admit: 2012-02-20 | Discharge: 2012-02-20 | Disposition: A | Discharge status: Home / Self Care | Payer: Medicare Other | Source: Ambulatory Visit | Attending: Family Medicine | Admitting: Family Medicine

## 2012-02-20 DIAGNOSIS — Z139 Encounter for screening, unspecified: Secondary | ICD-10-CM

## 2012-02-20 DIAGNOSIS — Z1231 Encounter for screening mammogram for malignant neoplasm of breast: Secondary | ICD-10-CM | POA: Insufficient documentation

## 2012-06-22 ENCOUNTER — Other Ambulatory Visit: Payer: Self-pay | Admitting: Family Medicine

## 2012-06-22 DIAGNOSIS — N644 Mastodynia: Secondary | ICD-10-CM

## 2012-06-27 ENCOUNTER — Ambulatory Visit (HOSPITAL_COMMUNITY)
Admission: RE | Admit: 2012-06-27 | Discharge: 2012-06-27 | Disposition: A | Discharge status: Home / Self Care | Payer: Medicare Other | Source: Ambulatory Visit | Attending: Family Medicine | Admitting: Family Medicine

## 2012-06-27 ENCOUNTER — Inpatient Hospital Stay (HOSPITAL_COMMUNITY)
Admission: RE | Admit: 2012-06-27 | Discharge: 2012-06-27 | Disposition: A | Discharge status: Home / Self Care | Payer: Medicare Other | Source: Ambulatory Visit | Attending: Family Medicine | Admitting: Family Medicine

## 2012-06-27 DIAGNOSIS — N644 Mastodynia: Secondary | ICD-10-CM | POA: Insufficient documentation

## 2012-08-06 ENCOUNTER — Emergency Department (HOSPITAL_COMMUNITY)
Admission: EM | Admit: 2012-08-06 | Discharge: 2012-08-06 | Disposition: A | Discharge status: Home / Self Care | Payer: Medicare Other | Attending: Emergency Medicine | Admitting: Emergency Medicine

## 2012-08-06 ENCOUNTER — Encounter (HOSPITAL_COMMUNITY): Payer: Self-pay | Admitting: *Deleted

## 2012-08-06 DIAGNOSIS — Z87891 Personal history of nicotine dependence: Secondary | ICD-10-CM | POA: Insufficient documentation

## 2012-08-06 DIAGNOSIS — Z8679 Personal history of other diseases of the circulatory system: Secondary | ICD-10-CM | POA: Insufficient documentation

## 2012-08-06 DIAGNOSIS — R112 Nausea with vomiting, unspecified: Secondary | ICD-10-CM | POA: Insufficient documentation

## 2012-08-06 DIAGNOSIS — K219 Gastro-esophageal reflux disease without esophagitis: Secondary | ICD-10-CM | POA: Insufficient documentation

## 2012-08-06 DIAGNOSIS — J45909 Unspecified asthma, uncomplicated: Secondary | ICD-10-CM | POA: Insufficient documentation

## 2012-08-06 DIAGNOSIS — D649 Anemia, unspecified: Secondary | ICD-10-CM | POA: Insufficient documentation

## 2012-08-06 DIAGNOSIS — J3489 Other specified disorders of nose and nasal sinuses: Secondary | ICD-10-CM | POA: Insufficient documentation

## 2012-08-06 DIAGNOSIS — R05 Cough: Secondary | ICD-10-CM | POA: Insufficient documentation

## 2012-08-06 DIAGNOSIS — Z79899 Other long term (current) drug therapy: Secondary | ICD-10-CM | POA: Insufficient documentation

## 2012-08-06 DIAGNOSIS — J329 Chronic sinusitis, unspecified: Secondary | ICD-10-CM | POA: Insufficient documentation

## 2012-08-06 DIAGNOSIS — R0982 Postnasal drip: Secondary | ICD-10-CM | POA: Insufficient documentation

## 2012-08-06 DIAGNOSIS — H9190 Unspecified hearing loss, unspecified ear: Secondary | ICD-10-CM | POA: Insufficient documentation

## 2012-08-06 DIAGNOSIS — Z8739 Personal history of other diseases of the musculoskeletal system and connective tissue: Secondary | ICD-10-CM | POA: Insufficient documentation

## 2012-08-06 DIAGNOSIS — Z8719 Personal history of other diseases of the digestive system: Secondary | ICD-10-CM | POA: Insufficient documentation

## 2012-08-06 DIAGNOSIS — R059 Cough, unspecified: Secondary | ICD-10-CM | POA: Insufficient documentation

## 2012-08-06 MED ORDER — AMOXICILLIN 500 MG PO CAPS: 1000.0000 mg | ORAL_CAPSULE | Freq: Three times a day (TID) | ORAL | Status: DC

## 2012-08-06 NOTE — ED Provider Notes (Signed)
History    This chart was scribed for Shelda Jakes, MD by Charolett Bumpers, ED Scribe. The patient was seen in room [facility]. Patient's care was started at 3:19 PM.   CSN: [id number]  Arrival date & time 08/06/12  1313   First MD Initiated Contact with Patient 08/06/12 1519      Chief Complaint  Patient presents with  . Headache    HPI Comments: Diana Wilson is a 52 y.o. female who presents to the Emergency Department complaining of reoccurring sinus headache since yesterday morning. Husband states that she was dx with sinus headaches. She states that when her sinuses bother her, it triggers her migraines. She reports tenderness in her frontal and bilateral maxillary regions. She rates her pain 6/10. She reports associated chills, post nasal drip, sinus pressure, cough, nausea and vomiting. She has tried OTC medications without relief. She denies any nasal congestion. She denies any recent abx use.    Patient is a 52 y.o. female presenting with headaches. The history is provided by the patient. No language interpreter was used.  Headache Pain location:  Frontal Onset quality:  Gradual Duration:  2 days Timing:  Constant Similar to prior headaches: yes   Relieved by:  Nothing Associated symptoms: cough, drainage, nausea, sinus pressure and vomiting   Associated symptoms: no abdominal pain, no back pain, no congestion, no diarrhea, no fever, no neck pain and no sore throat     Past Medical History  Diagnosis Date  . Asthma   . Migraine   . Lightheadedness   . Seasonal allergies   . Irritable bowel syndrome   . Scoliosis   . GERD (gastroesophageal reflux disease)   . Constipation   . Diarrhea   . Anemia   . Hard of hearing   . History of submucous nasal surgery   . S/P myringotomy with insertion of tube     bilateral    Past Surgical History  Procedure Laterality Date  . Back surgery    . Abdominal hysterectomy    . Tonsillectomy    . Eye surgery     . Colonoscopy  05/26/2011    Procedure: COLONOSCOPY;  Surgeon: Malissa Hippo, MD;  Location: [facility];  Service: Endoscopy;  Laterality: N/A;  9:30 am    Family History  Problem Relation Age of Onset  . Adopted: Yes    History  Substance Use Topics  . Smoking status: Former Smoker -- 0.50 packs/day for 5 years  . Smokeless tobacco: Not on file  . Alcohol Use: No    OB History   Grav Para Term Preterm Abortions TAB SAB Ect Mult Living                  Review of Systems  Constitutional: Positive for chills. Negative for fever.  HENT: Positive for postnasal drip and sinus pressure. Negative for congestion, sore throat and neck pain.   Eyes: Negative for visual disturbance.  Respiratory: Positive for cough. Negative for shortness of breath.   Cardiovascular: Negative for chest pain and leg swelling.  Gastrointestinal: Positive for nausea and vomiting. Negative for abdominal pain and diarrhea.  Genitourinary: Negative for dysuria.  Musculoskeletal: Negative for back pain.  Skin: Negative for rash.  Neurological: Positive for headaches.  Hematological: Does not bruise/bleed easily.  Psychiatric/Behavioral: Negative for confusion.  All other systems reviewed and are negative.    Allergies  Codeine; Other; Pseudoephedrine; and Sulfonamide derivatives  Home Medications   Current  Outpatient Rx  Name  Route  Sig  Dispense  Refill  . acyclovir (ZOVIRAX) 400 MG tablet   Oral   Take 400 mg by mouth daily.         Marland Kitchen amoxicillin (AMOXIL) 500 MG capsule   Oral   Take 2 capsules (1,000 mg total) by mouth 3 (three) times daily.   30 capsule   0   . Calcium Carbonate-Vitamin D (CALCIUM + D) 600-200 MG-UNIT TABS   Oral   Take 1 tablet by mouth 2 (two) times daily.         . fish oil-omega-3 fatty acids 1000 MG capsule   Oral   Take 1 g by mouth 2 (two) times daily.         Marland Kitchen lactobacillus acidophilus (BACID) TABS   Oral   Take 1 tablet by mouth daily.          . Multiple Vitamin (MULITIVITAMIN WITH MINERALS) TABS   Oral   Take 1 tablet by mouth daily.         . niacin 500 MG tablet   Oral   Take 500 mg by mouth daily with breakfast.         . omeprazole (PRILOSEC) 20 MG capsule   Oral   Take 20 mg by mouth daily.         . Soy Isoflavones 40 MG TABS   Oral   Take 80 mg by mouth daily.         . vitamin C (ASCORBIC ACID) 500 MG tablet   Oral   Take 500 mg by mouth daily.           Triage Vitals: BP 138/81  Pulse 76  Temp(Src) 97.9 F (36.6 C) (Oral)  Resp 18  Ht 5\' 1"  (1.549 m)  Wt 147 lb (66.679 kg)  BMI 27.79 kg/m2  SpO2 100%  Physical Exam  Nursing note and vitals reviewed. Constitutional: She is oriented to person, place, and time. She appears well-developed and well-nourished. No distress.  HENT:  Head: Normocephalic and atraumatic.  Right Ear: Tympanic membrane, external ear and ear canal normal.  Left Ear: Tympanic membrane, external ear and ear canal normal.  Tender over maxillary and frontal sinuses.   Eyes: Conjunctivae and EOM are normal. Pupils are equal, round, and reactive to light.  Neck: Normal range of motion. Neck supple. No tracheal deviation present.  Cardiovascular: Normal rate, regular rhythm and normal heart sounds.   No murmur heard. Pulmonary/Chest: Effort normal and breath sounds normal. No respiratory distress. She has no wheezes. She has no rales.  Abdominal: Soft. Bowel sounds are normal. She exhibits no distension. There is no tenderness.  Musculoskeletal: Normal range of motion. She exhibits no edema.  Lymphadenopathy:    She has no cervical adenopathy.  Neurological: She is alert and oriented to person, place, and time. No cranial nerve deficit.  Skin: Skin is warm and dry.  Psychiatric: She has a normal mood and affect. Her behavior is normal.    ED Course  Procedures (including critical care time)  DIAGNOSTIC STUDIES: Oxygen Saturation is 100% on RA, normal by my  interpretation.    COORDINATION OF CARE:  3:43 PM-Discussed planned course of treatment with the patient including starting her on antibiotics, who is agreeable at this time.     Labs Reviewed - No data to display No results found.   1. Sinusitis       MDM  Patient with sinus tenderness over  the frontal and maxillary sinuses. Patient's had a history of sinusitis in the past symptoms currently remind her of that. No vomiting today. Headache is improved somewhat compared to yesterday. No fevers. 1. We treat with antibiotic amoxicillin for sinusitis or followup with her primary care Dr. if not improved or return here if worse. She feels the symptoms are currently more consistent with a sinus infection than her history of migraines. Patient nontoxic no acute distress.   I personally performed the services described in this documentation, which was scribed in my presence. The recorded information has been reviewed and is accurate.        Shelda Jakes, MD 08/06/12 [phone]

## 2012-08-06 NOTE — ED Notes (Signed)
Sinus migraine headache since yesterday with nausea and dry heaves.  Light sensitivity.  Pt states, "I just want an abx so I can go home and lay down."

## 2012-09-03 ENCOUNTER — Ambulatory Visit (INDEPENDENT_AMBULATORY_CARE_PROVIDER_SITE_OTHER): Payer: Medicare Other | Admitting: Nurse Practitioner

## 2012-09-03 ENCOUNTER — Encounter: Payer: Self-pay | Admitting: Nurse Practitioner

## 2012-09-03 VITALS — BP 142/88 | Temp 97.9°F | Ht 61.0 in | Wt 148.4 lb

## 2012-09-03 DIAGNOSIS — N390 Urinary tract infection, site not specified: Secondary | ICD-10-CM

## 2012-09-03 LAB — POCT UA - MICROSCOPIC ONLY
Bacteria, U Microscopic: 0
Mucus, UA: 0
RBC, urine, microscopic: 0

## 2012-09-03 LAB — POCT URINALYSIS DIPSTICK
Blood, UA: 10
pH, UA: 7

## 2012-09-03 MED ORDER — CIPROFLOXACIN HCL 500 MG PO TABS: 500.0000 mg | ORAL_TABLET | Freq: Two times a day (BID) | ORAL | Status: DC

## 2012-09-03 NOTE — Patient Instructions (Addendum)
May use AZO as directed for 48 hours then stop.  You can buy this at the store.  It helps bladder spasms.  It will turn your urine orange.   Urinary Tract Infection Urinary tract infections (UTIs) can develop anywhere along your urinary tract. Your urinary tract is your body's drainage system for removing wastes and extra water. Your urinary tract includes two kidneys, two ureters, a bladder, and a urethra. Your kidneys are a pair of bean-shaped organs. Each kidney is about the size of your fist. They are located below your ribs, one on each side of your spine. CAUSES Infections are caused by microbes, which are microscopic organisms, including fungi, viruses, and bacteria. These organisms are so small that they can only be seen through a microscope. Bacteria are the microbes that most commonly cause UTIs. SYMPTOMS  Symptoms of UTIs may vary by age and gender of the patient and by the location of the infection. Symptoms in young women typically include a frequent and intense urge to urinate and a painful, burning feeling in the bladder or urethra during urination. Older women and men are more likely to be tired, shaky, and weak and have muscle aches and abdominal pain. A fever may mean the infection is in your kidneys. Other symptoms of a kidney infection include pain in your back or sides below the ribs, nausea, and vomiting. DIAGNOSIS To diagnose a UTI, your caregiver will ask you about your symptoms. Your caregiver also will ask to provide a urine sample. The urine sample will be tested for bacteria and white blood cells. White blood cells are made by your body to help fight infection. TREATMENT  Typically, UTIs can be treated with medication. Because most UTIs are caused by a bacterial infection, they usually can be treated with the use of antibiotics. The choice of antibiotic and length of treatment depend on your symptoms and the type of bacteria causing your infection. HOME CARE  INSTRUCTIONS  If you were prescribed antibiotics, take them exactly as your caregiver instructs you. Finish the medication even if you feel better after you have only taken some of the medication.  Drink enough water and fluids to keep your urine clear or pale yellow.  Avoid caffeine, tea, and carbonated beverages. They tend to irritate your bladder.  Empty your bladder often. Avoid holding urine for long periods of time.  Empty your bladder before and after sexual intercourse.  After a bowel movement, women should cleanse from front to back. Use each tissue only once. SEEK MEDICAL CARE IF:   You have back pain.  You develop a fever.  Your symptoms do not begin to resolve within 3 days. SEEK IMMEDIATE MEDICAL CARE IF:   You have severe back pain or lower abdominal pain.  You develop chills.  You have nausea or vomiting.  You have continued burning or discomfort with urination. MAKE SURE YOU:   Understand these instructions.  Will watch your condition.  Will get help right away if you are not doing well or get worse. Document Released: 02/09/2005 Document Revised: 11/01/2011 Document Reviewed: 06/10/2011 [facility] Patient Information 2013 [place], [place].

## 2012-09-03 NOTE — Progress Notes (Signed)
Subjective:  Presents complaints of urinary symptoms began yesterday morning. Extreme frequency. Urgency. Throbbing pain with urination. No fever. No back pain. No vaginal discharge. Married, same sexual partner. No hematuria. No history of recent UTI.  Objective:   BP 142/88  Temp(Src) 97.9 F (36.6 C) (Oral)  Ht 5\' 1"  (1.549 m)  Wt 148 lb 6 oz (67.302 kg)  BMI 28.05 kg/m2 NAD. Alert, oriented. Lungs clear. Heart regular rate rhythm. No CVA area tenderness. Abdomen soft nondistended with moderate suprapubic area tenderness on exam. Urine microscopic rare WBC although urine is very dilute. UA shows leukocytes and blood.  Assessment:UTI (urinary tract infection) - Plan: POCT urinalysis dipstick, POCT UA - Microscopic Only  Plan: Meds ordered this encounter  Medications  . ciprofloxacin (CIPRO) 500 MG tablet    Sig: Take 1 tablet (500 mg total) by mouth 2 (two) times daily.    Dispense:  14 tablet    Refill:  0    Order Specific Question:  Supervising Provider    Answer:  Merlyn Albert [[id number]]   callback in 72 hours if no improvement symptoms, sooner if worse.  May use AZO as directed for 48 hours then stop.

## 2012-09-12 ENCOUNTER — Telehealth: Payer: Self-pay | Admitting: Family Medicine

## 2012-09-12 NOTE — Telephone Encounter (Signed)
Discussed with patient. Patient verbalized understanding. 

## 2012-09-12 NOTE — Telephone Encounter (Signed)
claritin 10 otc daily

## 2012-09-12 NOTE — Telephone Encounter (Signed)
Wants to know what is the best thing to take for allergies.  Had sinus headache on Sunday, now her nose is running and she is sneezing.  Please call patient as soon as possible.  Thanks

## 2012-09-14 ENCOUNTER — Encounter: Payer: Self-pay | Admitting: *Deleted

## 2012-12-07 ENCOUNTER — Encounter: Payer: Self-pay | Admitting: Family Medicine

## 2012-12-07 ENCOUNTER — Ambulatory Visit (INDEPENDENT_AMBULATORY_CARE_PROVIDER_SITE_OTHER): Payer: Medicare Other | Admitting: Family Medicine

## 2012-12-07 VITALS — BP 124/76 | Temp 98.2°F | Wt 151.0 lb

## 2012-12-07 DIAGNOSIS — J45909 Unspecified asthma, uncomplicated: Secondary | ICD-10-CM

## 2012-12-07 DIAGNOSIS — G43909 Migraine, unspecified, not intractable, without status migrainosus: Secondary | ICD-10-CM

## 2012-12-07 DIAGNOSIS — E785 Hyperlipidemia, unspecified: Secondary | ICD-10-CM

## 2012-12-07 DIAGNOSIS — J4520 Mild intermittent asthma, uncomplicated: Secondary | ICD-10-CM

## 2012-12-07 MED ORDER — AMOXICILLIN-POT CLAVULANATE 875-125 MG PO TABS: 1.0000 | ORAL_TABLET | Freq: Two times a day (BID) | ORAL | Status: AC

## 2012-12-07 NOTE — Progress Notes (Signed)
  Subjective:    Patient ID: Diana Wilson, female    DOB: Mar 17, 1961, 52 y.o.   MRN: [id number]  Sinusitis This is a new problem. The current episode started in the past 7 days. The problem has been gradually worsening since onset. The maximum temperature recorded prior to her arrival was 100 - 100.9 F. Her pain is at a severity of 7/10. The pain is moderate. Associated symptoms include chills and headaches. Past treatments include nothing.   Headache frontal in nature primarily. Slight congestion and chest.   Review of Systems  Constitutional: Positive for chills.  Neurological: Positive for headaches.   ROS otherwise negative     Objective:   Physical Exam  Alert mild malaise. HEENT moderate nasal congestion. Frontal tenderness. Pharynx erythematous lungs clear. Heart regular in rhythm.      Assessment & Plan:  Impression rhinosinusitis. Plan antibiotics prescribed. Symptomatic care discussed. WSL

## 2012-12-09 DIAGNOSIS — G43909 Migraine, unspecified, not intractable, without status migrainosus: Secondary | ICD-10-CM | POA: Insufficient documentation

## 2012-12-09 DIAGNOSIS — E785 Hyperlipidemia, unspecified: Secondary | ICD-10-CM | POA: Insufficient documentation

## 2012-12-09 DIAGNOSIS — J45909 Unspecified asthma, uncomplicated: Secondary | ICD-10-CM | POA: Insufficient documentation

## 2013-01-02 ENCOUNTER — Ambulatory Visit (INDEPENDENT_AMBULATORY_CARE_PROVIDER_SITE_OTHER): Payer: Medicare Other | Admitting: Nurse Practitioner

## 2013-01-02 ENCOUNTER — Encounter: Payer: Self-pay | Admitting: Nurse Practitioner

## 2013-01-02 VITALS — BP 110/80 | Ht 63.25 in | Wt 149.8 lb

## 2013-01-02 DIAGNOSIS — Z0000 Encounter for general adult medical examination without abnormal findings: Secondary | ICD-10-CM

## 2013-01-02 DIAGNOSIS — M8580 Other specified disorders of bone density and structure, unspecified site: Secondary | ICD-10-CM | POA: Insufficient documentation

## 2013-01-02 DIAGNOSIS — R5381 Other malaise: Secondary | ICD-10-CM

## 2013-01-02 DIAGNOSIS — M899 Disorder of bone, unspecified: Secondary | ICD-10-CM

## 2013-01-02 DIAGNOSIS — Z01419 Encounter for gynecological examination (general) (routine) without abnormal findings: Secondary | ICD-10-CM

## 2013-01-02 LAB — HEPATIC FUNCTION PANEL
ALT: 21 U/L (ref 0–35)
AST: 17 U/L (ref 0–37)
Albumin: 4.6 g/dL (ref 3.5–5.2)
Bilirubin, Direct: 0.1 mg/dL (ref 0.0–0.3)
Total Bilirubin: 0.5 mg/dL (ref 0.3–1.2)

## 2013-01-02 LAB — LIPID PANEL
Cholesterol: 206 mg/dL — ABNORMAL HIGH (ref 0–200)
Triglycerides: 232 mg/dL — ABNORMAL HIGH (ref <150)
VLDL: 46 mg/dL — ABNORMAL HIGH (ref 0–40)

## 2013-01-02 LAB — BASIC METABOLIC PANEL
BUN: 12 mg/dL (ref 6–23)
Chloride: 104 mEq/L (ref 96–112)
Creat: 0.71 mg/dL (ref 0.50–1.10)
Glucose, Bld: 79 mg/dL (ref 70–99)
Potassium: 4 mEq/L (ref 3.5–5.3)

## 2013-01-02 MED ORDER — FLUTICASONE PROPIONATE 50 MCG/ACT NA SUSP: NASAL | Status: DC

## 2013-01-02 NOTE — Progress Notes (Signed)
  Subjective:    Patient ID: Diana Wilson, female    DOB: 1961/04/18, 52 y.o.   MRN: [id number]  HPI presents for wellness checkup. Has chronic hearing loss, cannot afford to get new hearing aids. Mild head congestion with clear runny nose. Has a dental problem, needs a tooth extraction but cannot afford dental care. Chronic urinary symptoms. Slight bladder leakage, no major incontinence. Regular eye exams. Patient is currently on niacin for her cholesterol, advised to stop this.    Review of Systems  Constitutional: Negative for fever, activity change, appetite change and fatigue.  HENT: Positive for hearing loss, congestion, rhinorrhea, dental problem and postnasal drip. Negative for ear pain and sore throat.   Eyes: Negative for visual disturbance.  Respiratory: Negative for cough, chest tightness, shortness of breath and wheezing.   Cardiovascular: Negative for chest pain and leg swelling.  Gastrointestinal: Negative for nausea, vomiting, abdominal pain, diarrhea and constipation.  Genitourinary: Positive for urgency, frequency, enuresis and dyspareunia. Negative for dysuria, vaginal bleeding, vaginal discharge, difficulty urinating, menstrual problem and pelvic pain.  Psychiatric/Behavioral: Negative for dysphoric mood. The patient is not nervous/anxious.        Objective:   Physical Exam  Vitals reviewed. Constitutional: She is oriented to person, place, and time. She appears well-developed. No distress.  HENT:  Right Ear: External ear normal.  Left Ear: External ear normal.  Mouth/Throat: Oropharynx is clear and moist.  Neck: Normal range of motion. Neck supple. No tracheal deviation present. No thyromegaly present.  Cardiovascular: Normal rate, regular rhythm and normal heart sounds.  Exam reveals no gallop.   No murmur heard. Pulmonary/Chest: Effort normal and breath sounds normal.  Abdominal: Soft. She exhibits no distension and no mass. There is tenderness. There is no  rebound and no guarding.  Genitourinary: Vagina normal. No vaginal discharge found.  Musculoskeletal: She exhibits no edema.  Lymphadenopathy:    She has no cervical adenopathy.  Neurological: She is alert and oriented to person, place, and time.  Skin: Skin is warm and dry. No rash noted.  Psychiatric: She has a normal mood and affect. Her behavior is normal.   Breast exam: No masses noted; axilla no adenopathy. Fairly dense tissue. External GU normal. Vagina pink and moist, no discharge noted. Rectal exam normal, no stool for Hemoccult. TMs mild clear effusion, no erythema.        Assessment & Plan:  Well woman exam  Routine general medical examination at a health care facility - Plan: Basic metabolic panel, Hepatic function panel, Lipid panel, POC Hemoccult Bld/Stl (3-Cd Home Screen), MM Digital Screening  Other malaise and fatigue - Plan: TSH  Osteopenia - Plan: Vit D25 hydroxy(osteoporosis monitoring)  vasomotor rhinitis Meds ordered this encounter  Medications  . fluticasone (FLONASE) 50 MCG/ACT nasal spray    Sig: 2 sprays each nostril qd prn congestion    Dispense:  16 g    Refill:  5    Order Specific Question:  Supervising Provider    Answer:  Merlyn Albert [[id number]]    Encouraged healthy diet with vitamin D and calcium. Regular activity. Next physical in one year.

## 2013-01-02 NOTE — Patient Instructions (Signed)
STOP NIACIN! Luvena 3-4 x per week for vaginal health Use KY jelly for intercourse

## 2013-01-03 LAB — VITAMIN D 25 HYDROXY (VIT D DEFICIENCY, FRACTURES): Vit D, 25-Hydroxy: 63 ng/mL (ref 30–89)

## 2013-01-07 ENCOUNTER — Other Ambulatory Visit (INDEPENDENT_AMBULATORY_CARE_PROVIDER_SITE_OTHER): Payer: Medicare Other | Admitting: *Deleted

## 2013-01-07 DIAGNOSIS — Z0000 Encounter for general adult medical examination without abnormal findings: Secondary | ICD-10-CM

## 2013-01-07 LAB — POC HEMOCCULT BLD/STL (HOME/3-CARD/SCREEN): Card #2 Fecal Occult Blod, POC: NEGATIVE

## 2013-01-09 ENCOUNTER — Telehealth: Payer: Self-pay | Admitting: Family Medicine

## 2013-01-09 NOTE — Telephone Encounter (Signed)
Pt wants to know if you will type up a letter for her on letter head stating she can not have jury duty due to her hearing issues and other health conditions? Please call pt when done or if you can't do this. Thanks

## 2013-01-16 ENCOUNTER — Telehealth: Payer: Self-pay | Admitting: Family Medicine

## 2013-01-16 NOTE — Telephone Encounter (Signed)
Patient states she has symptoms of a yeast infection.  Can not use creams.  Would like a prescription called into [facility] for these symptoms.  States she will be in town for the next couple of hours if this can be phoned in quickly.  Also, Patient needs a letter to excuse her from [employer].  States she needs it to state she has to urinate frequently, she does not hear well.  Needs this letter as soon as possible.   Please call Patient. Thanks

## 2013-01-17 NOTE — Telephone Encounter (Signed)
Diflucan 150 two one p o three d apart 

## 2013-01-18 ENCOUNTER — Encounter: Payer: Self-pay | Admitting: Nurse Practitioner

## 2013-01-18 MED ORDER — FLUCONAZOLE 150 MG PO TABS: ORAL_TABLET | ORAL | Status: DC

## 2013-01-18 NOTE — Telephone Encounter (Signed)
Rx was sent electronically to [facility] . Patient notified.

## 2013-01-22 ENCOUNTER — Telehealth: Payer: Self-pay | Admitting: Family Medicine

## 2013-01-22 NOTE — Telephone Encounter (Signed)
Pt states that the Luvena 3-4 x per week for vaginal health is causing her to have server burning in the vaginal area when she applies the [place]. Pt states she doesn't want to apply it again without talking to [place] first.

## 2013-01-23 NOTE — Telephone Encounter (Signed)
Left message to return call 

## 2013-01-23 NOTE — Telephone Encounter (Signed)
Stop using Luvena; use plain Replens generic

## 2013-01-24 NOTE — Telephone Encounter (Signed)
Notified patient stop using Luvena; use plain Replens generic. Patient verbalized understanding.

## 2013-02-01 ENCOUNTER — Telehealth: Payer: Self-pay | Admitting: Nurse Practitioner

## 2013-02-01 NOTE — Telephone Encounter (Signed)
Stop Luvena.  Switch to generic Replens every 2-3 days as needed for dryness. This should not cause any burning.  Call back if problem persists.

## 2013-02-01 NOTE — Telephone Encounter (Signed)
Left message on voicemail to return call.

## 2013-02-01 NOTE — Telephone Encounter (Signed)
Patient says that she can't use the Levuna because it burns too bad. Please advise.

## 2013-02-01 NOTE — Telephone Encounter (Signed)
Patient states she has tried the Replens and it did not work. She wants to know if she can use some olive oil in the area. Please advise.

## 2013-02-07 NOTE — Telephone Encounter (Signed)
Olive oil should be fine. Would recommend reg oil NOT extra virgin.

## 2013-02-07 NOTE — Telephone Encounter (Signed)
It was routed to [place] last. Awaiting response.

## 2013-02-07 NOTE — Telephone Encounter (Signed)
Has this been completed? If so, please close encounter. °

## 2013-02-08 NOTE — Telephone Encounter (Signed)
Patient notified

## 2013-02-21 ENCOUNTER — Ambulatory Visit (HOSPITAL_COMMUNITY): Payer: Medicare Other

## 2013-02-21 ENCOUNTER — Encounter: Payer: Self-pay | Admitting: Family Medicine

## 2013-02-21 ENCOUNTER — Ambulatory Visit (INDEPENDENT_AMBULATORY_CARE_PROVIDER_SITE_OTHER): Payer: Medicare Other | Admitting: Family Medicine

## 2013-02-21 VITALS — BP 126/84 | Temp 98.4°F | Ht 61.0 in | Wt 150.1 lb

## 2013-02-21 DIAGNOSIS — Z79899 Other long term (current) drug therapy: Secondary | ICD-10-CM

## 2013-02-21 DIAGNOSIS — Z23 Encounter for immunization: Secondary | ICD-10-CM

## 2013-02-21 DIAGNOSIS — R5381 Other malaise: Secondary | ICD-10-CM

## 2013-02-21 DIAGNOSIS — E876 Hypokalemia: Secondary | ICD-10-CM

## 2013-02-21 LAB — CBC WITH DIFFERENTIAL/PLATELET
Basophils Absolute: 0 10*3/uL (ref 0.0–0.1)
Eosinophils Relative: 3 % (ref 0–5)
Lymphocytes Relative: 40 % (ref 12–46)
Lymphs Abs: 2 10*3/uL (ref 0.7–4.0)
Neutro Abs: 2.5 10*3/uL (ref 1.7–7.7)
Neutrophils Relative %: 50 % (ref 43–77)
Platelets: 257 10*3/uL (ref 150–400)
RBC: 4.45 MIL/uL (ref 3.87–5.11)
RDW: 14.6 % (ref 11.5–15.5)
WBC: 4.9 10*3/uL (ref 4.0–10.5)

## 2013-02-21 MED ORDER — DICLOFENAC SODIUM 75 MG PO TBEC: 75.0000 mg | DELAYED_RELEASE_TABLET | Freq: Two times a day (BID) | ORAL | Status: DC | PRN

## 2013-02-21 NOTE — Progress Notes (Signed)
  Subjective:    Patient ID: Diana Wilson, female    DOB: [birth year], 52 y.o.   MRN: [id number]  HPI  Felt light headed. Got into a cold sweat. Feel like was going to pass out.  Lasted for about an hour.  Needs to exercise she knows, but not exercising regularly.  Patient is fatigued all of the time. But the spells are hitting her hard.    Review of Systems No chest pain no abdominal pain no shortness of breath ROS otherwise negative    Objective:   Physical Exam  Alert no apparent distress. Lungs clear. Heart regular rate and rhythm. H&T normal.      Assessment & Plan:  Impression #1 probable vasovagal spells plan was significant fatigue will add some screening blood work. Further recommendations based on results. WSL

## 2013-02-21 NOTE — Progress Notes (Signed)
  Subjective:    Patient ID: Diana Wilson, female    DOB: 1960/12/29, 52 y.o.   MRN: [id number]  HPI Patient states that yesterday she broke out in a cold sweat and felt very fatigue and weak. She states that after she ate she started feeling a little better. She stated that it has happened about 3-4 times so far this year.    Review of Systems     Objective:   Physical Exam        Assessment & Plan:

## 2013-02-22 ENCOUNTER — Ambulatory Visit: Payer: Medicare Other

## 2013-02-22 LAB — BASIC METABOLIC PANEL
BUN: 11 mg/dL (ref 6–23)
Calcium: 10.4 mg/dL (ref 8.4–10.5)
Creat: 0.71 mg/dL (ref 0.50–1.10)
Glucose, Bld: 87 mg/dL (ref 70–99)

## 2013-02-22 LAB — CALCIUM: Calcium: 10.4 mg/dL (ref 8.4–10.5)

## 2013-02-28 ENCOUNTER — Ambulatory Visit (HOSPITAL_COMMUNITY)
Admission: RE | Admit: 2013-02-28 | Discharge: 2013-02-28 | Disposition: A | Discharge status: Home / Self Care | Payer: Medicare Other | Source: Ambulatory Visit | Attending: Nurse Practitioner | Admitting: Nurse Practitioner

## 2013-02-28 DIAGNOSIS — Z0000 Encounter for general adult medical examination without abnormal findings: Secondary | ICD-10-CM

## 2013-02-28 DIAGNOSIS — Z1231 Encounter for screening mammogram for malignant neoplasm of breast: Secondary | ICD-10-CM | POA: Insufficient documentation

## 2013-03-06 ENCOUNTER — Other Ambulatory Visit: Payer: Self-pay | Admitting: Nurse Practitioner

## 2013-04-18 ENCOUNTER — Encounter: Payer: Self-pay | Admitting: Family Medicine

## 2013-04-18 ENCOUNTER — Ambulatory Visit (INDEPENDENT_AMBULATORY_CARE_PROVIDER_SITE_OTHER): Payer: Medicare Other | Admitting: Family Medicine

## 2013-04-18 VITALS — BP 128/82 | Temp 98.1°F | Ht 61.0 in | Wt 150.2 lb

## 2013-04-18 DIAGNOSIS — J329 Chronic sinusitis, unspecified: Secondary | ICD-10-CM

## 2013-04-18 MED ORDER — AMOXICILLIN 500 MG PO TABS: 500.0000 mg | ORAL_TABLET | Freq: Three times a day (TID) | ORAL | Status: DC

## 2013-04-18 NOTE — Progress Notes (Signed)
   Subjective:    Patient ID: Diana Wilson, female    DOB: 1961-02-12, 52 y.o.   MRN: [id number]  Sinusitis This is a new problem. The current episode started today. Associated symptoms include ear pain, headaches, sinus pressure and a sore throat. Past treatments include nothing.   Minimal cough, headache  No fever diminished energy  achey   Review of Systems  HENT: Positive for ear pain, sinus pressure and sore throat.   Neurological: Positive for headaches.   No vomiting no diarrhea no rash ROS otherwise negative    Objective:   Physical Exam  Alert hydration good. Vital stable. Frontal maxillary tenderness bilateral. Moderate nasal congestion. Pharynx normal neck supple. Lungs clear heart regular in rhythm.      Assessment & Plan:  Impression 1 acute rhinosinusitis plan antibiotics prescribed. Symptomatic care discussed. WSL

## 2013-06-07 ENCOUNTER — Telehealth: Payer: Self-pay | Admitting: Family Medicine

## 2013-06-07 NOTE — Telephone Encounter (Signed)
Error

## 2013-06-17 ENCOUNTER — Other Ambulatory Visit: Payer: Self-pay | Admitting: Family Medicine

## 2013-08-01 ENCOUNTER — Encounter: Payer: Self-pay | Admitting: Family Medicine

## 2013-08-01 ENCOUNTER — Ambulatory Visit (INDEPENDENT_AMBULATORY_CARE_PROVIDER_SITE_OTHER): Payer: Medicare HMO | Admitting: Family Medicine

## 2013-08-01 VITALS — BP 124/80 | Temp 98.6°F | Ht 61.0 in | Wt 151.0 lb

## 2013-08-01 DIAGNOSIS — H6691 Otitis media, unspecified, right ear: Secondary | ICD-10-CM

## 2013-08-01 DIAGNOSIS — H6690 Otitis media, unspecified, unspecified ear: Secondary | ICD-10-CM

## 2013-08-01 MED ORDER — AMOXICILLIN 500 MG PO CAPS: 500.0000 mg | ORAL_CAPSULE | Freq: Three times a day (TID) | ORAL | Status: DC

## 2013-08-01 NOTE — Patient Instructions (Signed)
Use advil for the discomfort

## 2013-08-01 NOTE — Progress Notes (Signed)
   Subjective:    Patient ID: Diana Wilson, female    DOB: 04/11/1961, 53 y.o.   MRN: [id number]  Oral Pain  This is a new problem. The current episode started 1 to 4 weeks ago. Associated symptoms comments: Right ear pain. Treatments tried: peroxide and mouthwash.   Notes some recent nasal congestion. Frontal headache at times. Right ear pain at times.  Also reports gum pain. Saw dentist not too long ago. No fever slight chills.   Review of Systems Minimal cough no chest pain no back pain no abdominal pain ROS otherwise negative    Objective:   Physical Exam Alert talkative no acute distress right TMs some retraction left TMs normal. Positive nasal discharge pharynx normal neck supple. Lungs clear heart rare rhythm. Nonspecific irritation distal tongue patient reports this has occurred in just the last few days. No thermal injury       Assessment & Plan:  Pression otitis media. Discussed. Plan a mocks 500 3 times a day 10 days. Symptomatic care discussed. WSL

## 2013-08-14 ENCOUNTER — Other Ambulatory Visit: Payer: Self-pay | Admitting: *Deleted

## 2013-08-26 ENCOUNTER — Ambulatory Visit (INDEPENDENT_AMBULATORY_CARE_PROVIDER_SITE_OTHER): Payer: Medicare HMO | Admitting: Nurse Practitioner

## 2013-08-26 ENCOUNTER — Telehealth: Payer: Self-pay | Admitting: Family Medicine

## 2013-08-26 ENCOUNTER — Encounter: Payer: Self-pay | Admitting: Nurse Practitioner

## 2013-08-26 VITALS — BP 138/84 | Temp 98.3°F | Ht 61.0 in | Wt 152.0 lb

## 2013-08-26 DIAGNOSIS — J069 Acute upper respiratory infection, unspecified: Secondary | ICD-10-CM

## 2013-08-26 MED ORDER — METHYLPREDNISOLONE ACETATE 40 MG/ML IJ SUSP
40.0000 mg | Freq: Once | INTRAMUSCULAR | Status: AC
  Administered 2013-08-26: 40 mg via INTRAMUSCULAR

## 2013-08-26 MED ORDER — AMOXICILLIN 500 MG PO CAPS: 500.0000 mg | ORAL_CAPSULE | Freq: Three times a day (TID) | ORAL | Status: DC

## 2013-08-26 NOTE — Telephone Encounter (Signed)
Patient calling to let us know that she has a fever again. Is not sure what it is, but she has a fever and would like a nurse to call her back. She was just seen today.

## 2013-08-27 NOTE — Telephone Encounter (Signed)
Pt says she feels fine today and has no fever.

## 2013-08-27 NOTE — Telephone Encounter (Signed)
Please check with Autumn; she may have called her back yesterday; if not, this is part of illness. Call back Wednesday if no better, office visit Tuesday if she is worse.

## 2013-08-28 ENCOUNTER — Encounter: Payer: Self-pay | Admitting: Nurse Practitioner

## 2013-08-28 NOTE — Progress Notes (Signed)
Subjective:  Presents complaints of ear pain sore throat and congestion that began 3 days ago. Began running a low-grade fever yesterday. Cough mainly in the morning. Slight runny nose. No headache or wheezing. No vomiting diarrhea or abdominal pain. Taking fluids well. Voiding normal limit.  Objective:   BP 138/84  Temp(Src) 98.3 F (36.8 C)  Ht 5\' 1"  (1.549 m)  Wt 152 lb (68.947 kg)  BMI 28.74 kg/m2 NAD. Alert, oriented. TMs clear effusion, no erythema. Pharynx clear with PND noted. Neck supple with mild soft anterior adenopathy. Lungs clear. Heart regular rate rhythm.  Assessment:Acute upper respiratory infections of unspecified site - Plan: methylPREDNISolone acetate (DEPO-MEDROL) injection 40 mg  Plan: Meds ordered this encounter  Medications  . amoxicillin (AMOXIL) 500 MG capsule    Sig: Take 1 capsule (500 mg total) by mouth 3 (three) times daily.    Dispense:  30 capsule    Refill:  0    Order Specific Question:  Supervising Provider    Answer:  Merlyn AlbertLUKING, WILLIAM S [[id number]]  . methylPREDNISolone acetate (DEPO-MEDROL) injection 40 mg    Sig:    OTC meds as directed for congestion. Warning signs reviewed. Call back in 72 hours if no improvement, sooner if worse.

## 2013-09-17 ENCOUNTER — Encounter: Payer: Self-pay | Admitting: Family Medicine

## 2013-09-17 ENCOUNTER — Other Ambulatory Visit: Payer: Self-pay | Admitting: Family Medicine

## 2013-09-17 ENCOUNTER — Ambulatory Visit (INDEPENDENT_AMBULATORY_CARE_PROVIDER_SITE_OTHER): Payer: Medicare HMO | Admitting: Family Medicine

## 2013-09-17 VITALS — BP 128/78 | Temp 98.9°F | Ht 61.0 in | Wt 150.0 lb

## 2013-09-17 DIAGNOSIS — J310 Chronic rhinitis: Secondary | ICD-10-CM

## 2013-09-17 DIAGNOSIS — J329 Chronic sinusitis, unspecified: Secondary | ICD-10-CM

## 2013-09-17 MED ORDER — CEPHALEXIN 500 MG PO CAPS: 500.0000 mg | ORAL_CAPSULE | Freq: Three times a day (TID) | ORAL | Status: DC

## 2013-09-17 NOTE — Progress Notes (Signed)
   Subjective:    Patient ID: Diana Wilson, female    DOB: 12/04/1960, 53 y.o.   MRN: [id number]  Sinus Problem This is a new problem. The current episode started today. Associated symptoms include congestion, coughing, ear pain, headaches and a sore throat.    Bad headache gunky cough and productive  Sig discomfort  Review of Systems  HENT: Positive for congestion, ear pain and sore throat.   Respiratory: Positive for cough.   Neurological: Positive for headaches.   No vomiting or diarrhea    Objective:   Physical Exam Alert mild malaise. Vitals reviewed. H&T moderate his congestion frontal tenderness. Pharynx erythematous neck supple. Lungs clear heart rare in rhythm.       Assessment & Plan:  Impression 1 acute rhinosinusitis plan antibiotics prescribed. Since Medicare discussed. Warning signs discussed. WSL

## 2013-09-19 ENCOUNTER — Other Ambulatory Visit: Payer: Self-pay

## 2013-09-19 MED ORDER — ACYCLOVIR 400 MG PO TABS: ORAL_TABLET | ORAL | Status: DC

## 2013-12-04 ENCOUNTER — Telehealth: Payer: Self-pay | Admitting: Family Medicine

## 2013-12-04 NOTE — Telephone Encounter (Signed)
no

## 2013-12-04 NOTE — Telephone Encounter (Signed)
Pt's 19 getting teeth pulled on Monday, does she need to take any antibiotics before or after?  States she's able to tolerate amoxicillan, she uses [facility], please call when done

## 2013-12-04 NOTE — Telephone Encounter (Signed)
Patient getting all 19 teeth pulled Monday- Does she need an antibiotic

## 2013-12-04 NOTE — Telephone Encounter (Signed)
Discussed with patient. Patient verbalized understanding. 

## 2013-12-13 ENCOUNTER — Ambulatory Visit (INDEPENDENT_AMBULATORY_CARE_PROVIDER_SITE_OTHER): Payer: Medicare HMO | Admitting: Family Medicine

## 2013-12-13 VITALS — Ht 61.0 in

## 2013-12-13 DIAGNOSIS — J329 Chronic sinusitis, unspecified: Secondary | ICD-10-CM

## 2013-12-13 MED ORDER — CEPHALEXIN 500 MG PO CAPS: 500.0000 mg | ORAL_CAPSULE | Freq: Three times a day (TID) | ORAL | Status: DC

## 2013-12-13 NOTE — Progress Notes (Signed)
   Subjective:    Patient ID: Diana Wilson, female    DOB: 05/22/1960, 53 y.o.   MRN: [id number]  Cough This is a new problem. The current episode started yesterday. Associated symptoms include headaches and nasal congestion.    Eyes were red and swollen  Bad itching and trouble waking up ryr e  Review of Systems  Respiratory: Positive for cough.   Neurological: Positive for headaches.   No vomiting no diarrhea no rash    Objective:   Physical Exam Alert vitals stable. Moderate malaise. H&T frontal maxillary tenderness. Neck tender nodes anteriorly. Supple. Lungs clear. Heart rare in rhythm.       Assessment & Plan:  Impression 1 rhinosinusitis-discussed plan antibiotics prescribed. Since Medicare discussed. WSL

## 2013-12-16 ENCOUNTER — Telehealth: Payer: Self-pay | Admitting: Family Medicine

## 2013-12-16 MED ORDER — ACYCLOVIR 400 MG PO TABS: ORAL_TABLET | ORAL | Status: DC

## 2013-12-16 NOTE — Telephone Encounter (Signed)
Ok plus 6 ref 

## 2013-12-16 NOTE — Telephone Encounter (Signed)
Pt requesting refill on her acyclovir (ZOVIRAX) 400 MG tablet Pt would like the Rx to have a couple refills [facility] Please call pt when done

## 2013-12-16 NOTE — Telephone Encounter (Signed)
Rx sent electronically to pharmacy. Patient notified. 

## 2014-01-08 ENCOUNTER — Telehealth: Payer: Self-pay | Admitting: Family Medicine

## 2014-01-08 MED ORDER — TRAMADOL HCL 50 MG PO TABS: 50.0000 mg | ORAL_TABLET | Freq: Four times a day (QID) | ORAL | Status: DC | PRN

## 2014-01-08 NOTE — Telephone Encounter (Signed)
Tramadol 50 mg 1 4 times a day when necessary. #24

## 2014-01-08 NOTE — Telephone Encounter (Signed)
Med faxed to pharm. Pt notified.  

## 2014-01-08 NOTE — Telephone Encounter (Signed)
Patient said that she had all her teeth pulled last week at the dentist and the dentist did not give her any pain medication. She wants to know if we can prescribe something.    [facility] [place]

## 2014-01-22 ENCOUNTER — Other Ambulatory Visit: Payer: Medicare HMO | Admitting: Obstetrics & Gynecology

## 2014-01-28 ENCOUNTER — Ambulatory Visit (INDEPENDENT_AMBULATORY_CARE_PROVIDER_SITE_OTHER): Payer: Medicare HMO | Admitting: Family Medicine

## 2014-01-28 ENCOUNTER — Encounter: Payer: Self-pay | Admitting: Family Medicine

## 2014-01-28 VITALS — BP 122/76 | Ht 61.0 in | Wt 150.0 lb

## 2014-01-28 DIAGNOSIS — M940 Chondrocostal junction syndrome [Tietze]: Secondary | ICD-10-CM

## 2014-01-28 MED ORDER — ETODOLAC 400 MG PO TABS: 400.0000 mg | ORAL_TABLET | Freq: Two times a day (BID) | ORAL | Status: DC

## 2014-01-28 NOTE — Patient Instructions (Signed)
Be sure to get your mammogram regularlu

## 2014-01-28 NOTE — Progress Notes (Signed)
   Subjective:    Patient ID: Diana Wilson, female    DOB: [birth year], 53 y.o.   MRN: [id number]  HPI Patient here today with left breast pain. Patient stated that the pain is a aching, throbbing & sore pain.  Left sided chest pain  One mo ago  Deep breath  aleave helped a little      Review of Systems No cough no congestion no fever no headache ROS otherwise    Objective:   Physical Exam  Alert slight malaise lungs clear. Heart regular in rhythm. Abdomen benign. Left breast when moved reveals diffuse tenderness along chest wall. Not with tender breasts. Discussed with patient. No mass at all in left or right breast      Assessment & Plan:  Impression costochondritis plan anti-inflammatory medicine prescribed. Symptomatic care discussed. WSL warning signs discussed

## 2014-02-04 ENCOUNTER — Other Ambulatory Visit: Payer: Self-pay | Admitting: Family Medicine

## 2014-02-04 DIAGNOSIS — R52 Pain, unspecified: Secondary | ICD-10-CM

## 2014-02-05 ENCOUNTER — Encounter: Payer: Self-pay | Admitting: Obstetrics & Gynecology

## 2014-02-05 ENCOUNTER — Ambulatory Visit (INDEPENDENT_AMBULATORY_CARE_PROVIDER_SITE_OTHER): Payer: Medicare HMO | Admitting: Obstetrics & Gynecology

## 2014-02-05 VITALS — BP 110/80 | Ht 61.0 in | Wt 152.0 lb

## 2014-02-05 DIAGNOSIS — Z01419 Encounter for gynecological examination (general) (routine) without abnormal findings: Secondary | ICD-10-CM

## 2014-02-05 DIAGNOSIS — Z1212 Encounter for screening for malignant neoplasm of rectum: Secondary | ICD-10-CM

## 2014-02-05 MED ORDER — ESTRADIOL 0.1 MG/GM VA CREA: 1.0000 | TOPICAL_CREAM | Freq: Every day | VAGINAL | Status: DC

## 2014-02-05 NOTE — Progress Notes (Signed)
Patient ID: Diana Wilson, female   DOB: Jul 17, 1960, 53 y.o.   MRN: [id number] Subjective:     Diana Wilson is a 53 y.o. female here for a routine exam.  No LMP recorded. Patient has had a hysterectomy. No obstetric history on file. Birth Control Method:  hyst Menstrual Calendar(currently): hyst  Current complaints: atrophic vaginal changes.   Current acute medical issues:  Several chronic   Recent Gynecologic History No LMP recorded. Patient has had a hysterectomy. Last Pap: ,   Last mammogram: 10/14,  normal  Past Medical History  Diagnosis Date  . Asthma   . Migraine   . Lightheadedness   . Seasonal allergies   . Irritable bowel syndrome   . Scoliosis   . GERD (gastroesophageal reflux disease)   . Constipation   . Diarrhea   . Anemia   . Hard of hearing   . History of submucous nasal surgery   . S/P myringotomy with insertion of tube     bilateral  . Genital herpes   . Osteopenia   . Nerve damage     Past Surgical History  Procedure Laterality Date  . Back surgery    . Abdominal hysterectomy    . Tonsillectomy    . Eye surgery    . Colonoscopy  05/26/2011    Procedure: COLONOSCOPY;  Surgeon: Malissa Hippo, MD;  Location: [facility];  Service: Endoscopy;  Laterality: N/A;  9:30 am  . Total abdominal hysterectomy w/ bilateral salpingoophorectomy Bilateral 2008  . Multiple tooth extractions      OB History   Grav Para Term Preterm Abortions TAB SAB Ect Mult Living                  History   Social History  . Marital Status: Widowed    Spouse Name: N/A    Number of Children: N/A  . Years of Education: N/A   Social History Main Topics  . Smoking status: Former Smoker -- 0.50 packs/day for 6 years  . Smokeless tobacco: Never Used  . Alcohol Use: No  . Drug Use: No  . Sexual Activity: Yes    Birth Control/ Protection: Surgical   Other Topics Concern  . None   Social History Narrative  . None    Family History  Problem Relation Age of  Onset  . Adopted: Yes     Review of Systems  Review of Systems  Constitutional: Negative for fever, chills, weight loss, malaise/fatigue and diaphoresis.  HENT: Negative for hearing loss, ear pain, nosebleeds, congestion, sore throat, neck pain, tinnitus and ear discharge.   Eyes: Negative for blurred vision, double vision, photophobia, pain, discharge and redness.  Respiratory: Negative for cough, hemoptysis, sputum production, shortness of breath, wheezing and stridor.   Cardiovascular: Negative for chest pain, palpitations, orthopnea, claudication, leg swelling and PND.  Gastrointestinal: negative for abdominal pain. Negative for heartburn, nausea, vomiting, diarrhea, constipation, blood in stool and melena.  Genitourinary: Negative for dysuria, urgency, frequency, hematuria and flank pain.  Musculoskeletal: Negative for myalgias, back pain, joint pain and falls.  Skin: Negative for itching and rash.  Neurological: Negative for dizziness, tingling, tremors, sensory change, speech change, focal weakness, seizures, loss of consciousness, weakness and headaches.  Endo/Heme/Allergies: Negative for environmental allergies and polydipsia. Does not bruise/bleed easily.  Psychiatric/Behavioral: Negative for depression, suicidal ideas, hallucinations, memory loss and substance abuse. The patient is not nervous/anxious and does not have insomnia.  Objective:    Physical Exam  Vitals reviewed. Constitutional: She is oriented to person, place, and time. She appears well-developed and well-nourished.  HENT:  Head: Normocephalic and atraumatic.        Right Ear: External ear normal.  Left Ear: External ear normal.  Nose: Nose normal.  Mouth/Throat: Oropharynx is clear and moist.  Eyes: Conjunctivae and EOM are normal. Pupils are equal, round, and reactive to light. Right eye exhibits no discharge. Left eye exhibits no discharge. No scleral icterus.  Neck: Normal range of motion. Neck  supple. No tracheal deviation present. No thyromegaly present.  Cardiovascular: Normal rate, regular rhythm, normal heart sounds and intact distal pulses.  Exam reveals no gallop and no friction rub.   No murmur heard. Respiratory: Effort normal and breath sounds normal. No respiratory distress. She has no wheezes. She has no rales. She exhibits no tenderness.  GI: Soft. Bowel sounds are normal. She exhibits no distension and no mass. There is no tenderness. There is no rebound and no guarding.  Genitourinary:  Breasts no masses skin changes or nipple changes bilaterally      Vulva is normal without lesions Vagina is pink moist without discharge Cervix absent Uterus is absent Adnexa is negative with absent ovaries  Rectal    hemoccult negative, normal tone, no masses  Musculoskeletal: Normal range of motion. She exhibits no edema and no tenderness.  Neurological: She is alert and oriented to person, place, and time. She has normal reflexes. She displays normal reflexes. No cranial nerve deficit. She exhibits normal muscle tone. Coordination normal.  Skin: Skin is warm and dry. No rash noted. No erythema. No pallor.  Psychiatric: She has a normal mood and affect. Her behavior is normal. Judgment and thought content normal.       Assessment:    Healthy female exam.   atrophic vaginal changes Plan:    Hormone replacement therapy: vaginal estrogen for atrophic changes. Mammogram ordered. Follow up in: 1 year.

## 2014-02-14 ENCOUNTER — Telehealth: Payer: Self-pay | Admitting: Family Medicine

## 2014-02-14 MED ORDER — ACYCLOVIR 400 MG PO TABS: ORAL_TABLET | ORAL | Status: DC

## 2014-02-14 NOTE — Telephone Encounter (Signed)
Medication sent to pharmacy. Left message on voicemail notifying patient.  

## 2014-02-14 NOTE — Telephone Encounter (Signed)
Ok 12 ref 

## 2014-02-14 NOTE — Telephone Encounter (Signed)
Patient would like a refill on her acyclovir (ZOVIRAX) 400 MG tablet  [facility]

## 2014-02-25 ENCOUNTER — Telehealth: Payer: Self-pay | Admitting: *Deleted

## 2014-02-25 ENCOUNTER — Ambulatory Visit (INDEPENDENT_AMBULATORY_CARE_PROVIDER_SITE_OTHER): Payer: Medicare HMO | Admitting: *Deleted

## 2014-02-25 DIAGNOSIS — Z23 Encounter for immunization: Secondary | ICD-10-CM

## 2014-02-25 IMAGING — CR DG CHEST 2V
2 series · 2 of 2 positions shown · non-contrast
Comparison: Chest x-ray of 11/28/2009

CLINICAL DATA: Chest pain, wheezing, former smoking history

CHEST - 2 VIEW

[view not recorded (1 of 2)]
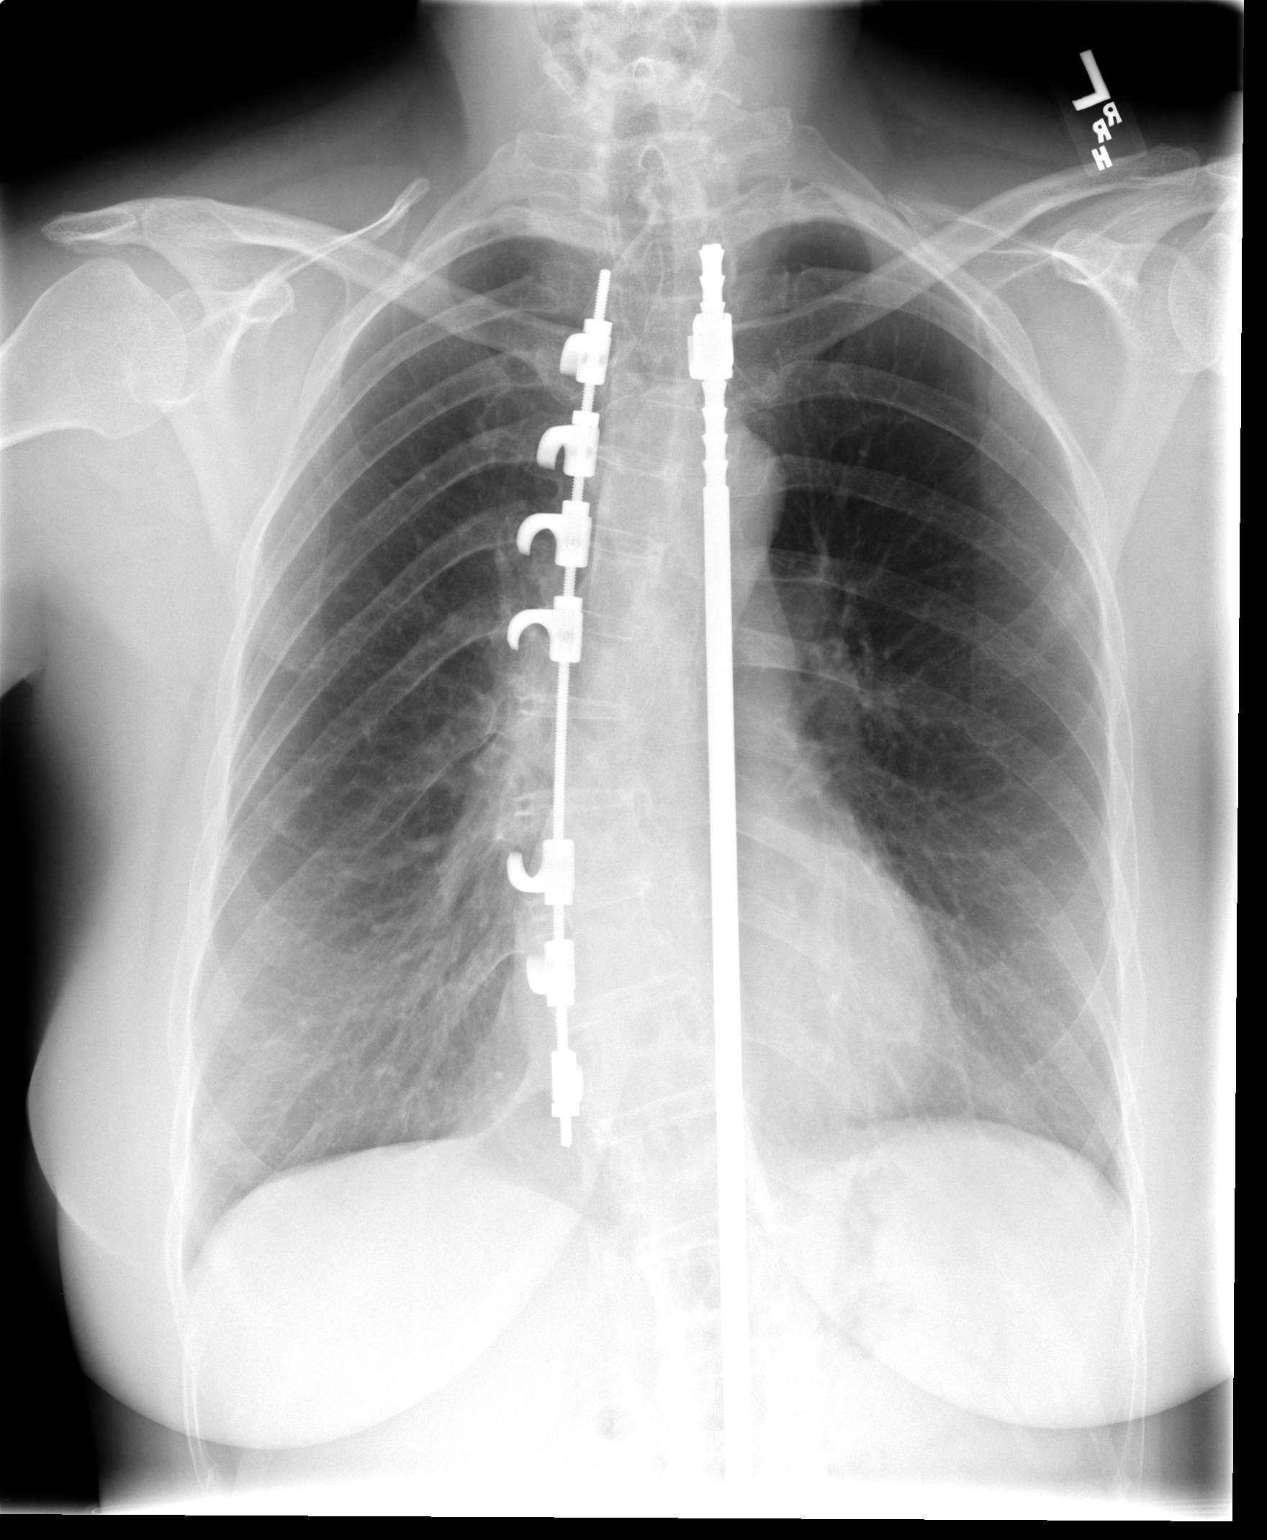

[view not recorded (2 of 2)]
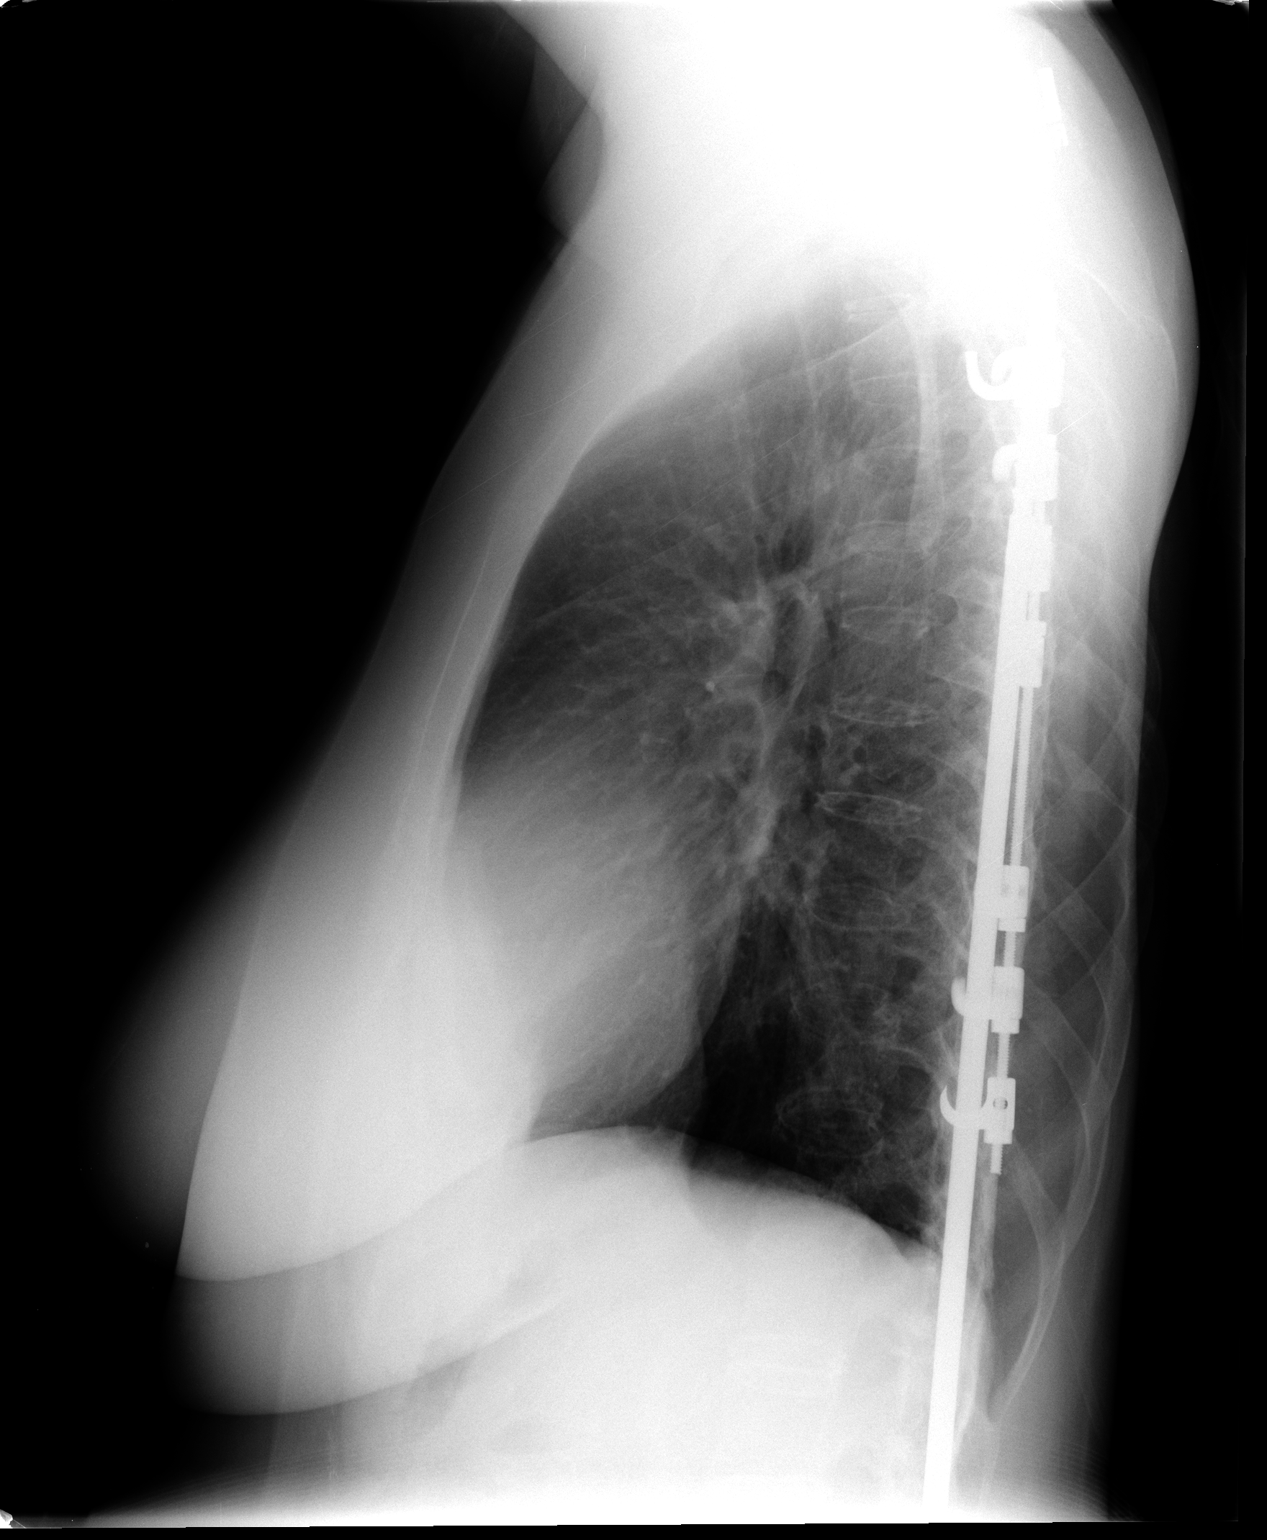

[2 of 2 positions shown; findings below may reference images not displayed]

FINDINGS: The lungs are clear and slightly hyperaerated.
Mediastinal contours are stable.  The heart is within normal limits
in size.  Thoracolumbar scoliosis is noted with Harrington rods
present.
IMPRESSION: Stable chest x-ray with slight hyperaeration.  No active lung
disease.  Harrington rods.

## 2014-02-25 NOTE — Telephone Encounter (Signed)
Pt wants to know if it is ok for her to take a

## 2014-03-04 ENCOUNTER — Ambulatory Visit (HOSPITAL_COMMUNITY)
Admission: RE | Admit: 2014-03-04 | Discharge: 2014-03-04 | Disposition: A | Discharge status: Home / Self Care | Payer: Medicare HMO | Source: Ambulatory Visit | Attending: Family Medicine | Admitting: Family Medicine

## 2014-03-04 DIAGNOSIS — N644 Mastodynia: Secondary | ICD-10-CM | POA: Insufficient documentation

## 2014-03-04 DIAGNOSIS — R52 Pain, unspecified: Secondary | ICD-10-CM

## 2014-03-17 ENCOUNTER — Encounter: Payer: Self-pay | Admitting: Nurse Practitioner

## 2014-03-17 ENCOUNTER — Ambulatory Visit (INDEPENDENT_AMBULATORY_CARE_PROVIDER_SITE_OTHER): Payer: Medicare HMO | Admitting: Nurse Practitioner

## 2014-03-17 VITALS — BP 110/82 | Temp 98.3°F | Ht 61.0 in | Wt 149.6 lb

## 2014-03-17 DIAGNOSIS — J0110 Acute frontal sinusitis, unspecified: Secondary | ICD-10-CM

## 2014-03-17 MED ORDER — AMOXICILLIN 500 MG PO CAPS: 500.0000 mg | ORAL_CAPSULE | Freq: Three times a day (TID) | ORAL | Status: DC

## 2014-03-17 MED ORDER — FLUTICASONE PROPIONATE 50 MCG/ACT NA SUSP: 2.0000 | Freq: Every day | NASAL | Status: AC

## 2014-03-17 NOTE — Patient Instructions (Signed)
estroven as directed

## 2014-03-19 ENCOUNTER — Encounter: Payer: Self-pay | Admitting: Nurse Practitioner

## 2014-03-19 NOTE — Progress Notes (Signed)
Subjective:  Presents with complaints of sinus symptoms over the past week. No fever. Some chills. Right frontal area headache. No sore throat. No ear pain. Occasional cough. Postnasal drainage.  Objective:   BP 110/82 mmHg  Temp(Src) 98.3 F (36.8 C) (Oral)  Ht 5\' 1"  (1.549 m)  Wt 149 lb 9.6 oz (67.858 kg)  BMI 28.28 kg/m2 NAD. Alert, oriented. TMs significant clear effusion, no erythema. Pharynx mildly erythematous with PND noted. Neck supple with mild soft anterior adenopathy. Lungs clear. Heart regular rate rhythm.  Assessment: Acute frontal sinusitis, recurrence not specified  Plan:  Meds ordered this encounter  Medications  . fluticasone (FLONASE) 50 MCG/ACT nasal spray    Sig: Place 2 sprays into both nostrils daily.    Dispense:  16 g    Refill:  6    Order Specific Question:  Supervising Provider    Answer:  Merlyn AlbertLUKING, WILLIAM S [[id number]]  . amoxicillin (AMOXIL) 500 MG capsule    Sig: Take 1 capsule (500 mg total) by mouth 3 (three) times daily.    Dispense:  30 capsule    Refill:  0    Order Specific Question:  Supervising Provider    Answer:  Merlyn AlbertLUKING, WILLIAM S [[id number]]   OTC antihistamines as directed. Call back if symptoms worsen or persist.

## 2014-04-01 ENCOUNTER — Ambulatory Visit: Payer: Medicare HMO | Admitting: Family Medicine

## 2014-04-02 ENCOUNTER — Ambulatory Visit (INDEPENDENT_AMBULATORY_CARE_PROVIDER_SITE_OTHER): Payer: Medicare HMO | Admitting: Family Medicine

## 2014-04-02 ENCOUNTER — Encounter: Payer: Self-pay | Admitting: Family Medicine

## 2014-04-02 VITALS — BP 128/80 | Temp 98.5°F | Ht 61.0 in | Wt 149.0 lb

## 2014-04-02 DIAGNOSIS — R300 Dysuria: Secondary | ICD-10-CM

## 2014-04-02 DIAGNOSIS — N390 Urinary tract infection, site not specified: Secondary | ICD-10-CM

## 2014-04-02 LAB — POCT URINALYSIS DIPSTICK
PH UA: 6
Spec Grav, UA: <=1.005

## 2014-04-02 MED ORDER — CIPROFLOXACIN HCL 250 MG PO TABS
250.0000 mg | ORAL_TABLET | Freq: Two times a day (BID) | ORAL | Status: DC
Start: 2014-04-02 — End: 2014-04-18

## 2014-04-02 NOTE — Progress Notes (Signed)
   Subjective:    Patient ID: Diana Wilson, female    DOB: 12/10/1960, 53 y.o.   MRN: [id number]  HPI  incr frequency  Some dysuria  No vom or diarrhea  Pos burning and incr freq   Increase frequency. Review of Systems No fever no chest pain no diarrhea some low back discomfort. Mild low abdominal discomfort.    Objective:   Physical Exam  Alert vital stable lungs clear heart regular in rhythm no CVA tenderness no acute distress. Abdomen benign  UA 4 white blood cells per high-power field    Assessment & Plan:  Impression urinary tract infection plan Cipro twice a day for 5 days symptomatic care discussed. WSL

## 2014-04-02 NOTE — Progress Notes (Deleted)
   Subjective:    Patient ID: Diana Wilson, female    DOB: 11/15/1960, 53 y.o.   MRN: [id number]  Dysuria  This is a new problem. The current episode started in the past 7 days. Associated symptoms comments: Low abd pain.  just finished an antibiotic. Pt states she is not sure if she has bladder infection or yeast infection.     Review of Systems  Genitourinary: Positive for dysuria.       Objective:   Physical Exam        Assessment & Plan:

## 2014-04-18 ENCOUNTER — Ambulatory Visit (INDEPENDENT_AMBULATORY_CARE_PROVIDER_SITE_OTHER): Payer: Medicare HMO | Admitting: Family Medicine

## 2014-04-18 ENCOUNTER — Encounter: Payer: Self-pay | Admitting: Family Medicine

## 2014-04-18 VITALS — BP 122/78 | Temp 97.9°F | Ht 61.0 in | Wt 152.0 lb

## 2014-04-18 DIAGNOSIS — J329 Chronic sinusitis, unspecified: Secondary | ICD-10-CM

## 2014-04-18 MED ORDER — CEPHALEXIN 500 MG PO CAPS: 500.0000 mg | ORAL_CAPSULE | Freq: Three times a day (TID) | ORAL | Status: DC

## 2014-04-18 NOTE — Progress Notes (Signed)
   Subjective:    Patient ID: Diana Wilson, female    DOB: 04/12/1961, 53 y.o.   MRN: [id number]  Cough This is a new problem. The current episode started yesterday. The cough is non-productive. Associated symptoms include rhinorrhea and a sore throat. Associated symptoms comments: Watery eyes. Nothing aggravates the symptoms. She has tried nothing for the symptoms. Her past medical history is significant for environmental allergies.   No vomiting no diarrhea   Review of Systems  HENT: Positive for rhinorrhea and sore throat.   Respiratory: Positive for cough.   Allergic/Immunologic: Positive for environmental allergies.   No rash    Objective:   Physical Exam  Alert hydration good. HEENT moderate nasal congestion. Frontal tenderness pharynx erythematous neck supple lungs rare rhonchi heart regular in rhythm      Assessment & Plan:  Impression acute rhinosinusitis plan antibiotics prescribed. Symptomatic care discussed. WSL

## 2014-07-02 ENCOUNTER — Ambulatory Visit (INDEPENDENT_AMBULATORY_CARE_PROVIDER_SITE_OTHER): Payer: Medicare HMO | Admitting: Family Medicine

## 2014-07-02 ENCOUNTER — Encounter: Payer: Self-pay | Admitting: Family Medicine

## 2014-07-02 VITALS — BP 110/70 | Temp 98.1°F | Ht 61.0 in | Wt 154.0 lb

## 2014-07-02 DIAGNOSIS — J329 Chronic sinusitis, unspecified: Secondary | ICD-10-CM

## 2014-07-02 MED ORDER — AMOXICILLIN 500 MG PO CAPS: 500.0000 mg | ORAL_CAPSULE | Freq: Three times a day (TID) | ORAL | Status: DC

## 2014-07-02 NOTE — Patient Instructions (Signed)
Three tspns of dimetapp liquid at bedtime for cough and congetion and drainage

## 2014-07-02 NOTE — Progress Notes (Signed)
   Subjective:    Patient ID: Diana Wilson, female    DOB: 03/31/1961, 54 y.o.   MRN: [id number]  Sinusitis This is a new problem. The current episode started in the past 7 days. The problem is unchanged. There has been no fever. The pain is moderate. Associated symptoms include headaches, sinus pressure and a sore throat. Treatments tried: claritin, benadryl. The treatment provided no relief.   Patient states that she has no other concerns at this time.   Some cough at night   Gagging with drainage at night  Pos sickness in the family with resp infxn Review of Systems  HENT: Positive for sinus pressure and sore throat.   Neurological: Positive for headaches.       Objective:   Physical Exam Alert no acute distress. Vitals stable. HEENT moderate his congestion frontal tenderness pharynx normal lungs clear heart regular in rhythm.       Assessment & Plan:  Alert impression acute rhinosinusitis plan antibiotics prescribed. Since Medicare discussed. Warning signs discussed WSL

## 2014-08-07 ENCOUNTER — Telehealth: Payer: Self-pay | Admitting: Family Medicine

## 2014-08-07 MED ORDER — ONDANSETRON 4 MG PO TBDP: 4.0000 mg | ORAL_TABLET | Freq: Three times a day (TID) | ORAL | Status: AC | PRN

## 2014-08-07 NOTE — Telephone Encounter (Signed)
Notified patient med has been sent to pharmacy. Use immodium otc and bland diet.

## 2014-08-07 NOTE — Telephone Encounter (Signed)
This message was taken this morning and is in the doctor's inbasket. Waiting for doctor to respond.

## 2014-08-07 NOTE — Telephone Encounter (Signed)
Pt called stating that she now has a low grade fever of 100. She isn't vomiting Or having diarrhea anymore. Pt wants a nurse to call her.

## 2014-08-07 NOTE — Telephone Encounter (Signed)
Vomiting and diarrhea, abd cramps, low grade fever. Started this am about 2:30.

## 2014-08-07 NOTE — Telephone Encounter (Signed)
zofr 4 odt 20, immod otc bland diet

## 2014-08-07 NOTE — Telephone Encounter (Signed)
Pt called stating that she is now experiencing the same symptoms that Dyane DustmanJames Swain  Has. Pt is wanting to know if something can be called in for her.   [facility] [place]

## 2014-09-16 ENCOUNTER — Other Ambulatory Visit: Payer: Self-pay | Admitting: Family Medicine

## 2014-09-16 ENCOUNTER — Telehealth: Payer: Self-pay | Admitting: Family Medicine

## 2014-09-16 MED ORDER — ACYCLOVIR 400 MG PO TABS: ORAL_TABLET | ORAL | Status: AC

## 2014-09-16 NOTE — Telephone Encounter (Signed)
Ok ref times 6 

## 2014-09-16 NOTE — Telephone Encounter (Signed)
Left message on voicemail notifying patient that med has been sent to pharmacy.  

## 2014-09-16 NOTE — Telephone Encounter (Signed)
acyclovir (ZOVIRAX) 400 MG tablet  Pt states she is leaving town in the morning She needs this med today as soon as possible please   [facility]

## 2014-11-20 ENCOUNTER — Telehealth: Payer: Self-pay | Admitting: Family Medicine

## 2014-11-20 MED ORDER — AMOXICILLIN 500 MG PO CAPS: ORAL_CAPSULE | ORAL | Status: AC

## 2014-11-20 NOTE — Telephone Encounter (Signed)
LMRC

## 2014-11-20 NOTE — Telephone Encounter (Signed)
Patient has a sinus infection, sore throat, ears draining. She said [place] the [place] yet. She wants to know if we can call in an antibiotic for her?   [facility] [place] [phone]

## 2014-11-20 NOTE — Telephone Encounter (Signed)
Patient last seen 06/21/2014

## 2014-11-20 NOTE — Telephone Encounter (Signed)
amox 500 tid times ten d, tell Diana Wilson we care about her, and because we do she absolutely needs for the future to have a primary care doc to do this, too much room for mistakes managing someone [place] miles away

## 2014-11-20 NOTE — Telephone Encounter (Signed)
Spoke with patient and informed patient that per Dr. Brett CanalesSteve Amoxicillin 500 MG three times a day for 10 days [facility] [place]. Also per Dr.Steve I informed patient that patient needs to find a primary care doctor closer to her to manage medications due to living hundreds of miles from this practice to avoid mistakes being made. Patient verbalized understanding.

## 2019-06-19 ENCOUNTER — Encounter: Payer: Self-pay | Admitting: Family Medicine

## 2021-05-11 ENCOUNTER — Encounter (INDEPENDENT_AMBULATORY_CARE_PROVIDER_SITE_OTHER): Payer: Self-pay | Admitting: *Deleted
